# Patient Record
Sex: Female | Born: 1965 | Race: White | Hispanic: No | Marital: Single
Health system: Midwestern US, Academic
[De-identification: ages and names within clinical notes are randomized; demographics above are authoritative.]

---

## 2019-03-08 ENCOUNTER — Encounter: Admit: 2019-03-08 | Discharge: 2019-03-08

## 2019-03-08 NOTE — Progress Notes
..  Patient arrived to [facility] for COVID-19 testing 03/08/19 0947. Patient identity confirmed via photo I.D. Nasopharyngeal procedure explained to the patient.   Nasopharyngeal swab completed left  Patient education provided given and instructed patient self isolate until contacted w/ results and further instructions.   Swab collected by Rexanne Mano RN.    Date symptoms began/reason for testing: 7-7 cough, sore throat, diarrhea, muscle aches, chills, congestion, fatigue, HA, runny nose

## 2019-03-09 ENCOUNTER — Encounter: Admit: 2019-03-08 | Discharge: 2019-03-09

## 2019-03-09 DIAGNOSIS — Z1159 Encounter for screening for other viral diseases: Secondary | ICD-10-CM

## 2019-03-09 LAB — COVID-19 (SARS-COV-2) PCR

## 2019-03-09 NOTE — Progress Notes
Called patient and verified full name and date of birth. Informed patient of NEGATIVE COVID 19 testing. Patient was asked if they were still experiencing any symptoms and responded with headache, diarrhea.  Patient was advised to contact PCP, specialist, and/or ordering provider physician for ongoing symptoms. Patient had no further questions.    Patient advised to notify their direct supervisor of negative results to make plans to return to work. Advised that if they continue to have symptoms after their negative results, they should notify their direct supervisor that test was negative but they are continuing to have symptoms. Advised to remain off work while they are having symptoms, following the sick time policy.    Employee notified that if they have been away from work longer than three days, they should contact [employer] at [phone] to obtain a return-to-work clearance.     Edson Snowball, RN

## 2019-03-09 NOTE — Progress Notes
Pt called regarding COVID 19 test results.  No answer.  Voice message left with callback number.  Will try to call back later.    Airrion Otting C. Maigan Bittinger, RN

## 2021-08-30 IMAGING — CT BRAIN WO(Adult)
2 of 4 series · 13 of 47 positions shown, 16 images · non-contrast
Comparison: No relevant prior studies available.

DIAGNOSTIC STUDIES

EXAM:  CT HEAD WITHOUT INTRAVENOUS CONTRAST  ([id number])
INDICATION: confusion ARRYTHMIA, PALPITATIONS, "DOESN'T FEEL RIGHT" PER PATIENT. HTN WITH SCIATIC
PAIN. HEADACHE. PT COULDN'T REMOVE EAR PIERCINGS. AB CT/NM: 0/0
TECHNIQUE: Axial computed tomography images of the head/brain without intravenous contrast.
Sagittal and coronal reformatted images were created and reviewed.

[Series 4: brain cor 5.00 hr40 s3 · coronal · 0.28mm/px · 3 of 39 slices shown]
[im 13/39  brain]
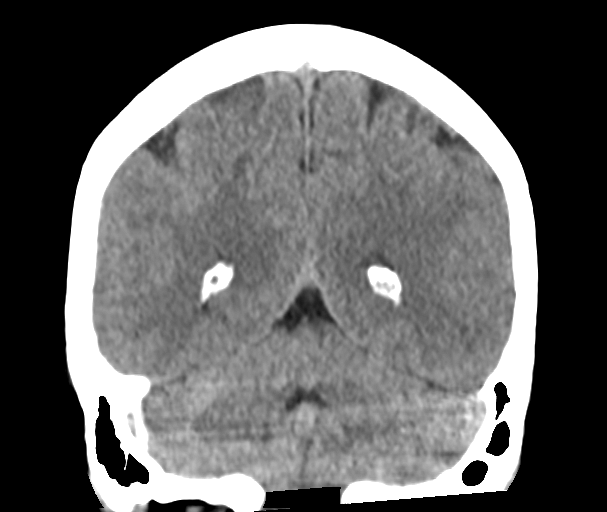
[im 17/39  brain]
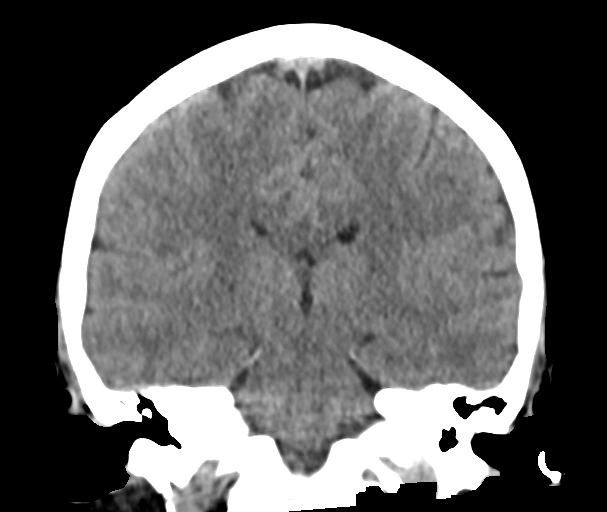
[im 22/39  brain]
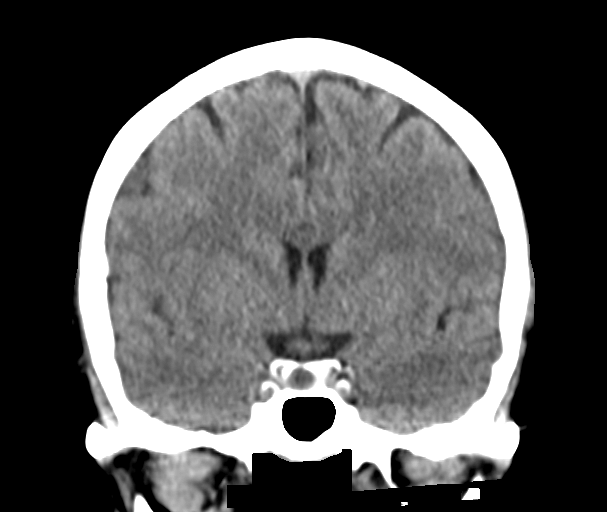

[Series 8: brain ax 2.00 hr60 s3 · axial · 0.34mm/px · z∈[-495,-373]mm · 10 of 71 slices shown, 13 images]
[im 7/71  brain]
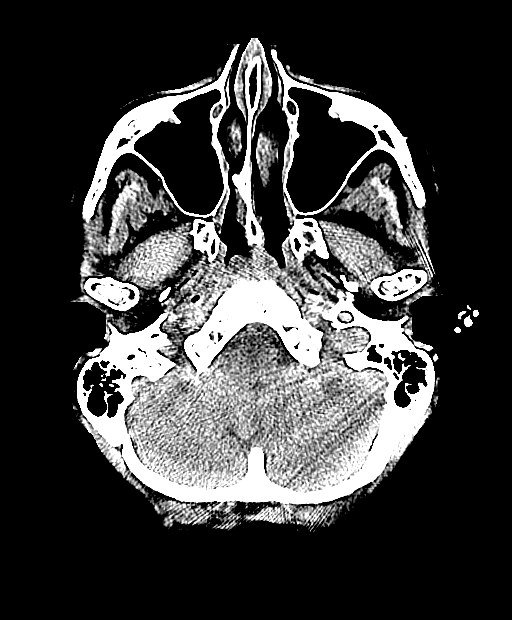
[im 7/71  bone]
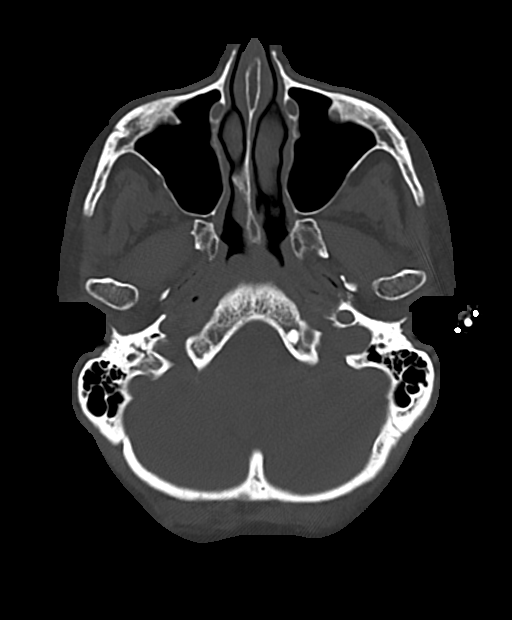
[im 14/71  brain]
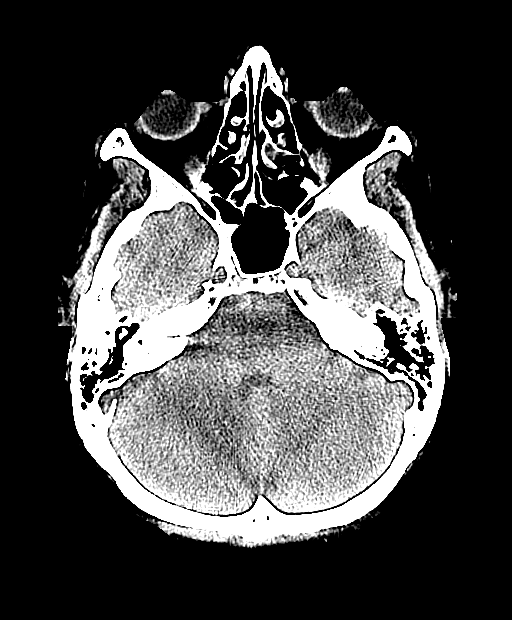
[im 21/71  brain]
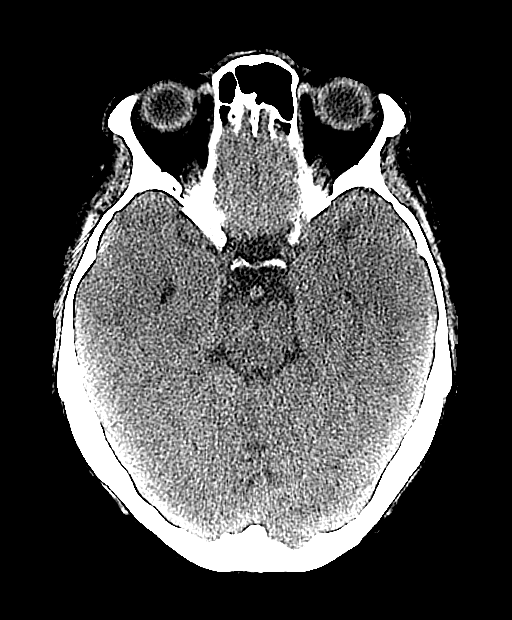
[im 27/71  brain]
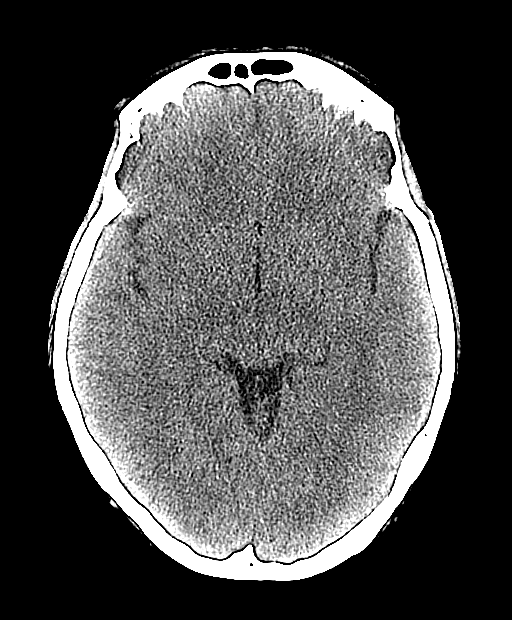
[im 34/71  brain]
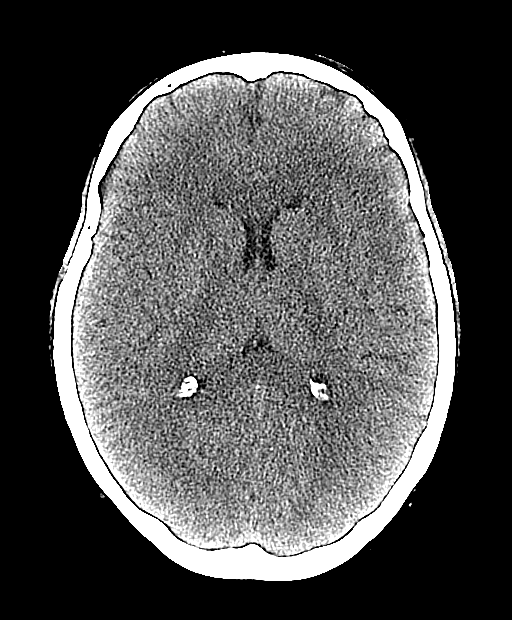
[im 34/71  bone]
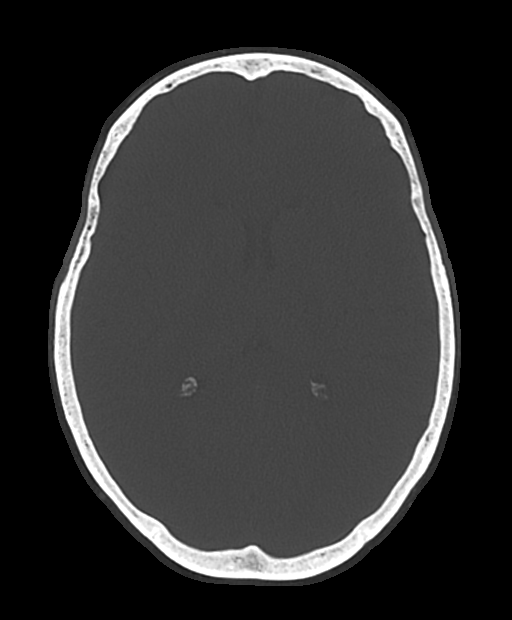
[im 41/71  brain]
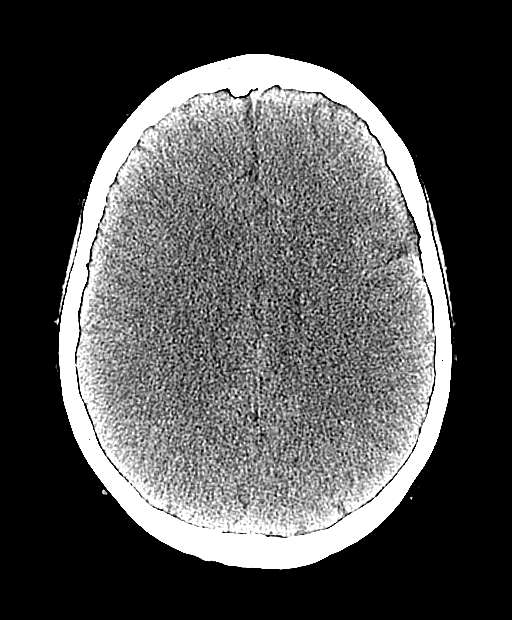
[im 47/71  brain]
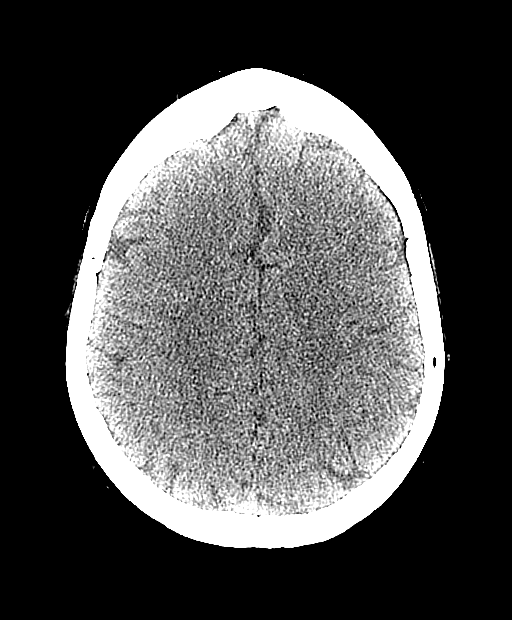
[im 54/71  brain]
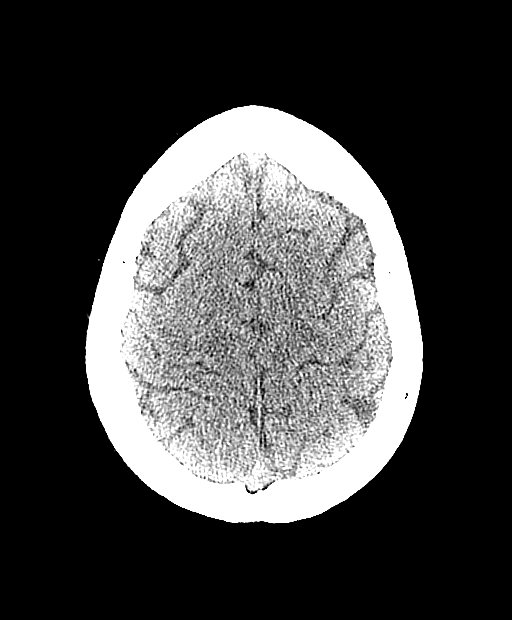
[im 61/71  brain]
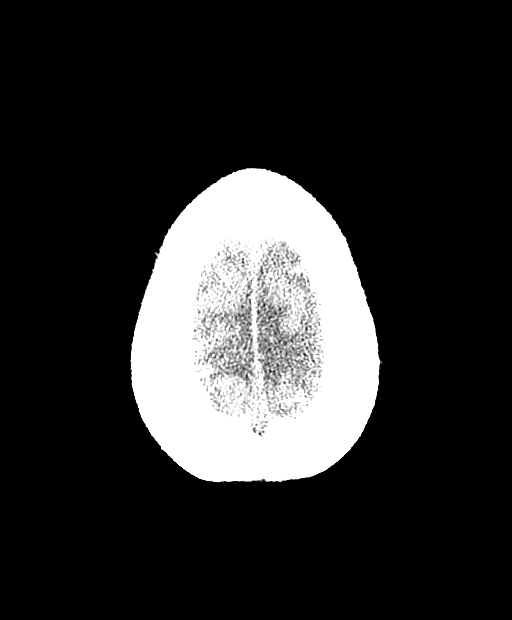
[im 61/71  bone]
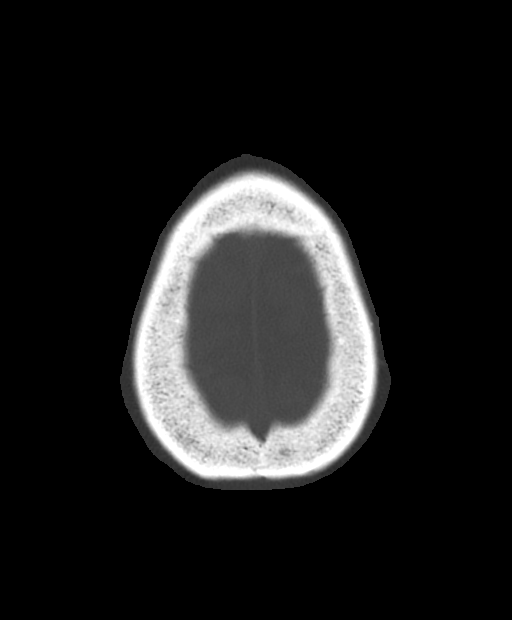
[im 67/71  brain]
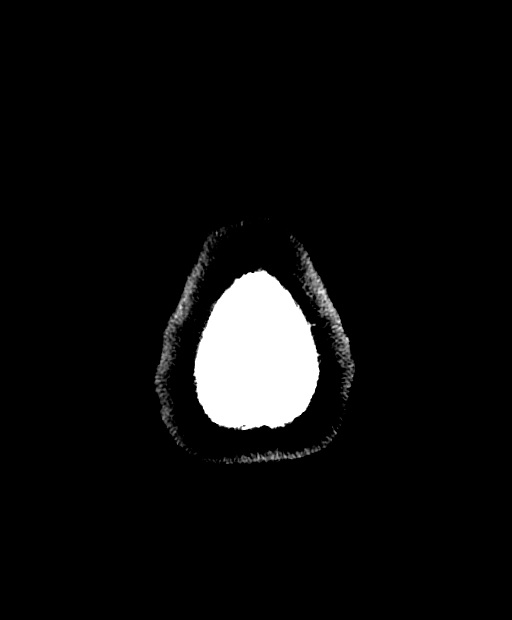

[13 of 47 positions shown; findings below may reference images not displayed]

All CT scans at this facility use dose modulation, interval reconstruction, and/or weight-based
dosing when appropriate to reduce radiation dose to as low as reasonably achievable.

Number of previous computed tomography exams in the last 12 months is 0. Number of previous nuclear
medicine myocardial perfusion studies in the last 12 months is 0.
FINDINGS: BRAIN:  NO evidence of posterior fossa/cerebellar hemorrhage or mass.

NO evidence of supratentorial hemorrhage or mass.

VENTRICLES:  The cerebral ventricles are normal in size.

BONES/JOINTS:  Calvarium and skull base demonstrate NO acute changes.

SOFT TISSUES:  Soft tissues demonstrate NO fluid collections or foreign body.

VASCULATURE:  Minimal arterial vascular calcification.

SINUSES:  The paranasal sinuses demonstrate NO significant opacifications or air-fluid levels.

MASTOID AIR CELLS:  NO opacifications or air-fluid levels.
IMPRESSION: - NO evidence of acute intracranial hemorrhage or mass.

Tech Notes:

ARRYTHMIA, PALPITATIONS, "DOESN'T FEEL RIGHT" PER PATIENT.  HTN WITH SCIATIC PAIN.  HEADACHE.  PT
COULDN'T REMOVE EAR PIERCINGS.  AB
CT/NM: 0/0

## 2021-08-30 IMAGING — CR [ID]
1 series · 1 of 1 positions shown · non-contrast
Comparison: No relevant prior studies available.

DIAGNOSTIC STUDIES

EXAM:  XR CHEST, 1 VIEW  ([id number])
INDICATION: hypertension ARRYTHMIA, PALPITATIONS, "DOESN'T FEEL RIGHT" PER PATIENT. HTN WITH
SCIATIC PAIN. HEADACHE. PT COULDN'T REMOVE EAR PIERCINGS. AB CT/NM: 0/0
TECHNIQUE: Single view of the chest.

[x chest ap]
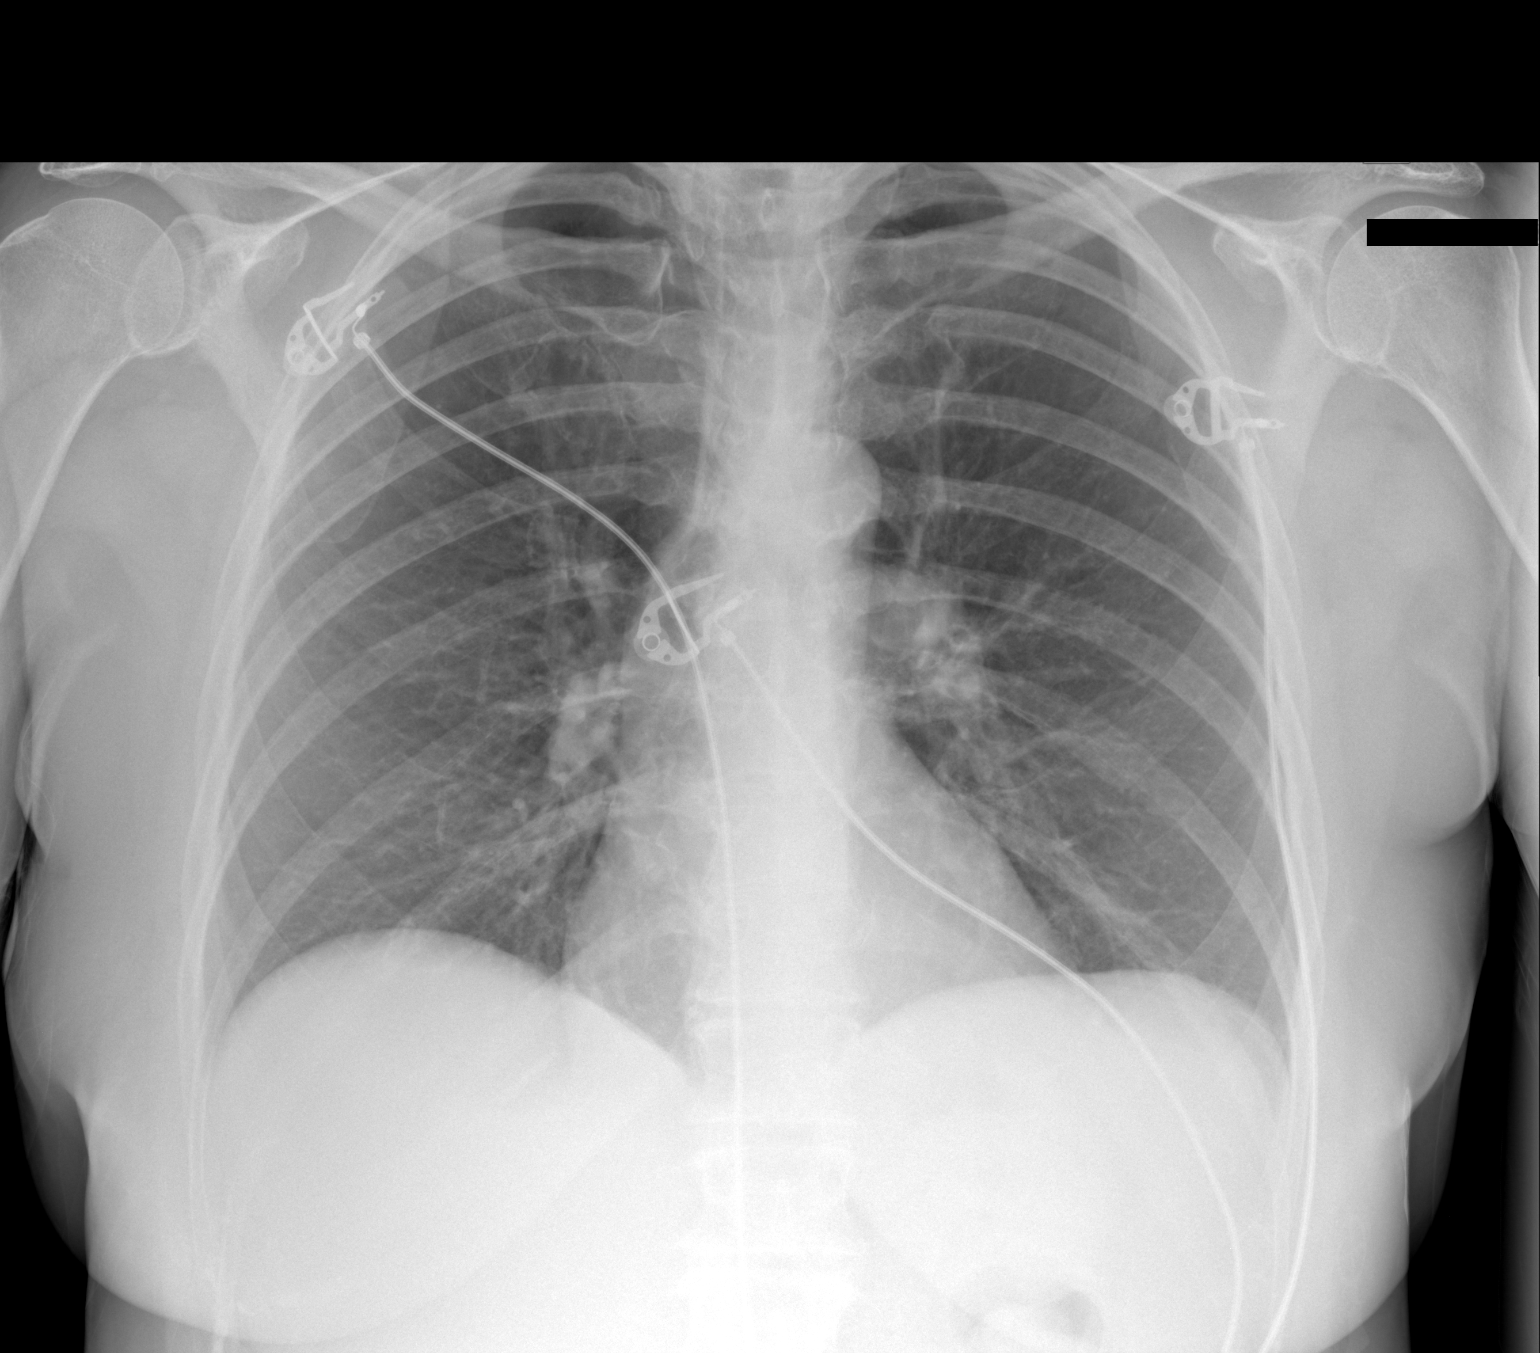

[1 of 1 positions shown; findings below may reference images not displayed]

FINDINGS: LUNGS:  The lungs demonstrate NO consolidations or significant effusions.

HEART:  The heart borders are mostly obscured, apex NOT visualized.

BONES/JOINTS:  No definite acute changes.

VASCULATURE:  Mild arterial vascular calcification.
IMPRESSION: - NO definite acute cardiopulmonary process.

Tech Notes:

ARRYTHMIA, PALPITATIONS, "DOESN'T FEEL RIGHT" PER PATIENT.  HTN WITH SCIATIC PAIN.  HEADACHE.  PT
COULDN'T REMOVE EAR PIERCINGS.  AB
CT/NM: 0/0

## 2021-08-31 IMAGING — MR Head^Brain
1 series · 48 of 48 positions shown · non-contrast
Comparison: none

[Series 4: cow · axial · 0.8mm · 0.56mm/px · z∈[-67,+4]mm · 48 of 87 slices shown]
[im 1/87]
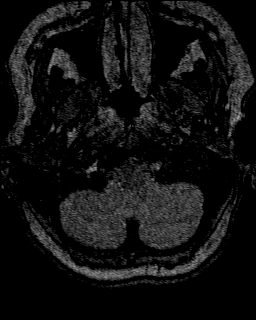
[im 2/87]
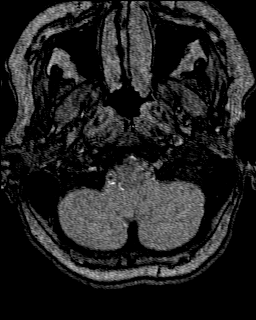
[im 4/87]
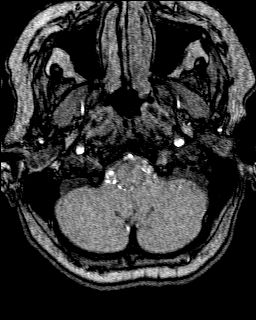
[im 6/87]
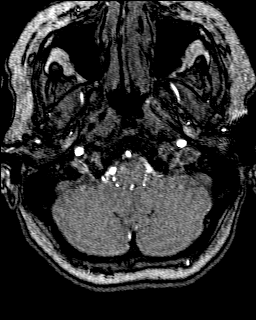
[im 8/87]
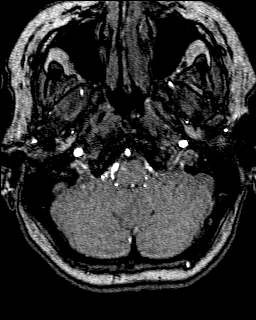
[im 10/87]
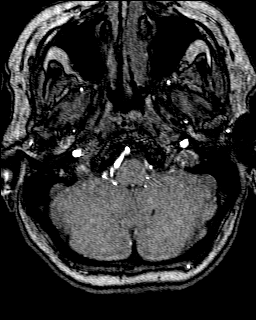
[im 11/87]
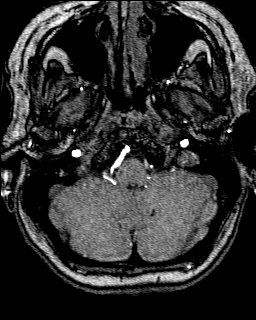
[im 13/87]
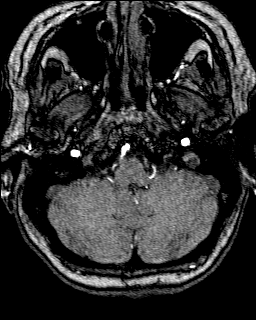
[im 15/87]
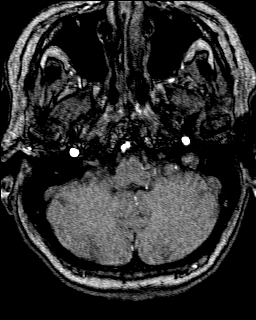
[im 17/87]
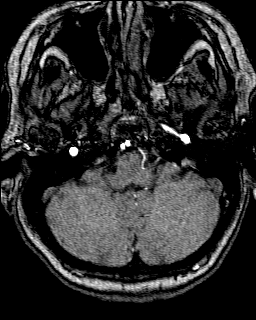
[im 19/87]
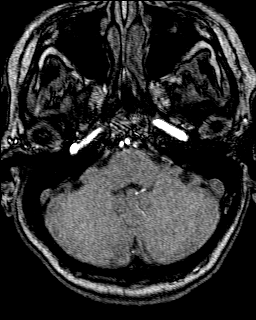
[im 21/87]
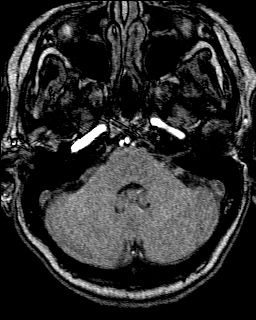
[im 22/87]
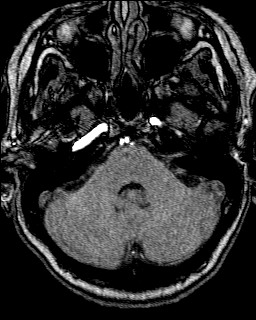
[im 24/87]
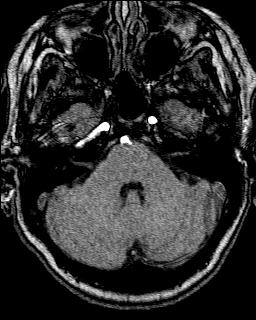
[im 26/87]
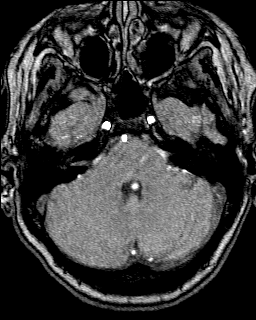
[im 28/87]
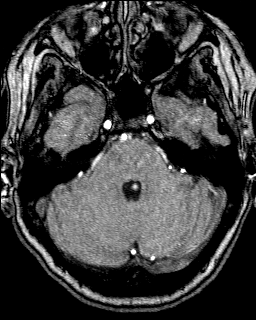
[im 30/87]
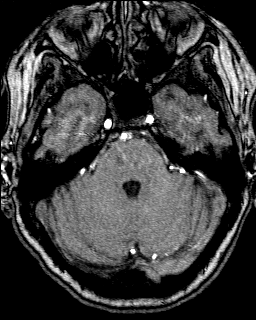
[im 32/87]
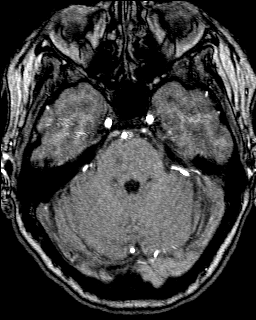
[im 33/87]
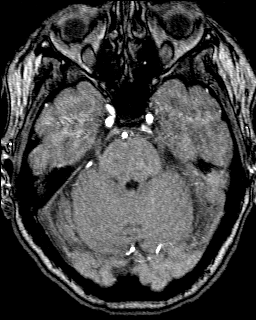
[im 35/87]
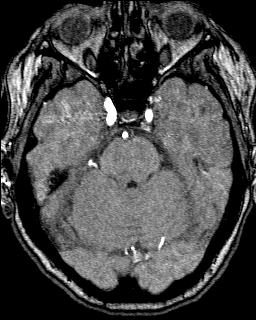
[im 37/87]
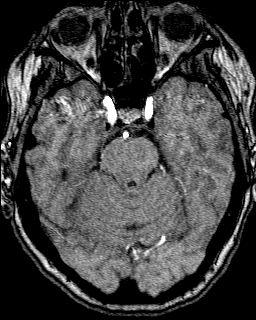
[im 39/87]
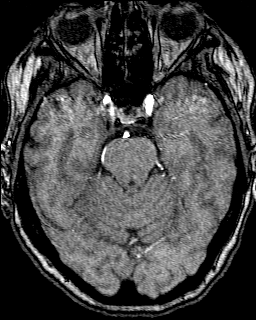
[im 41/87]
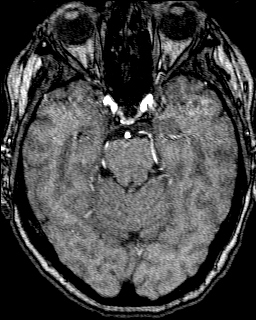
[im 43/87]
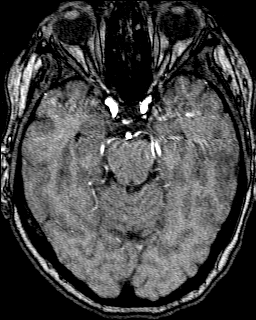
[im 44/87]
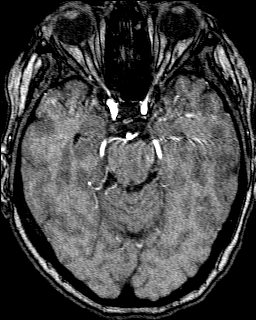
[im 46/87]
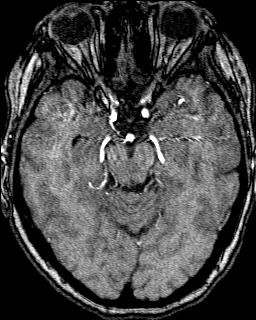
[im 48/87]
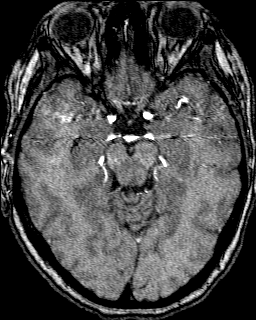
[im 50/87]
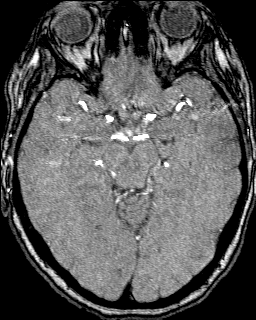
[im 52/87]
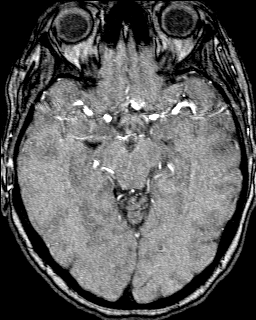
[im 54/87]
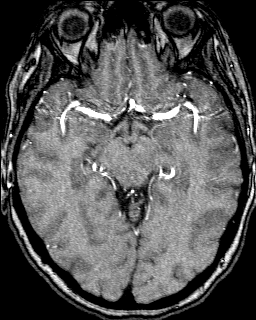
[im 55/87]
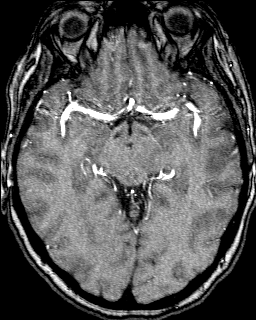
[im 57/87]
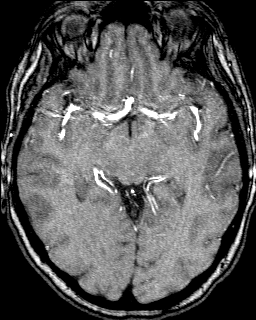
[im 59/87]
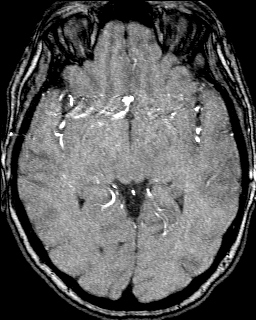
[im 61/87]
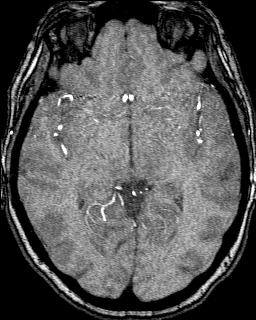
[im 63/87]
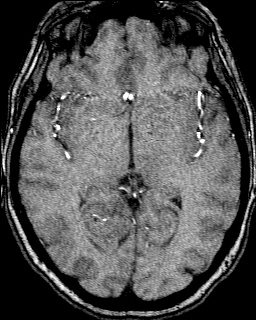
[im 65/87]
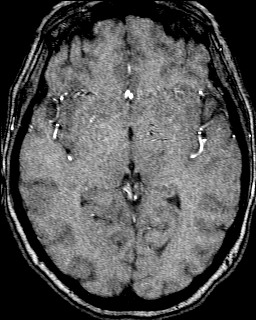
[im 66/87]
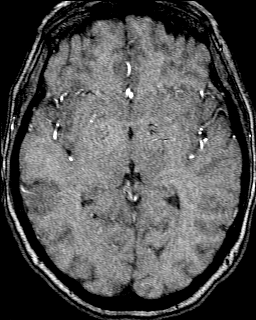
[im 68/87]
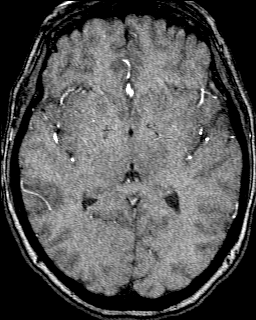
[im 70/87]
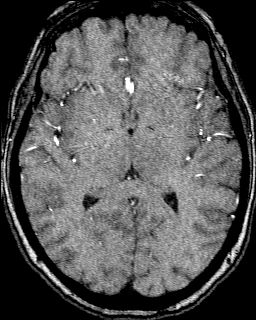
[im 72/87]
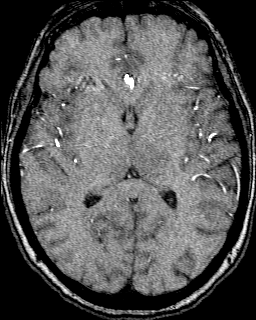
[im 74/87]
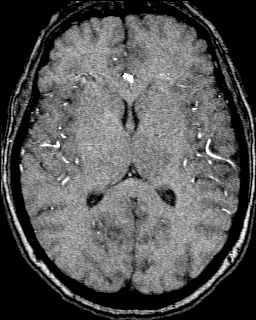
[im 76/87]
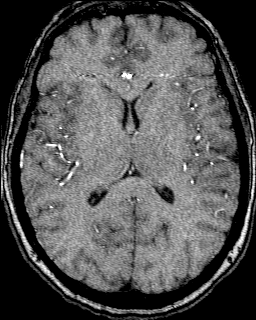
[im 77/87]
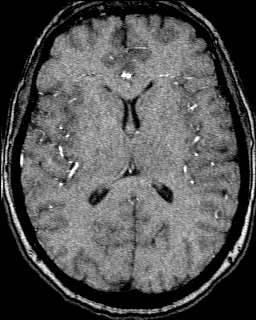
[im 79/87]
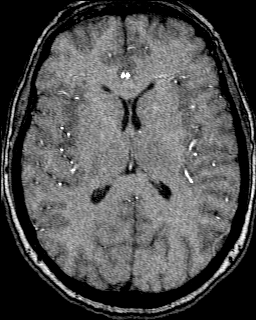
[im 81/87]
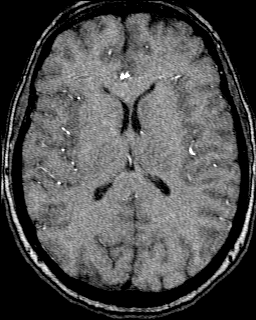
[im 83/87]
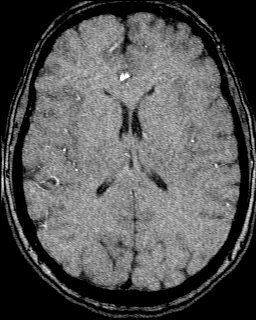
[im 85/87]
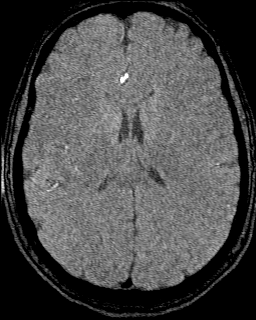
[im 87/87]
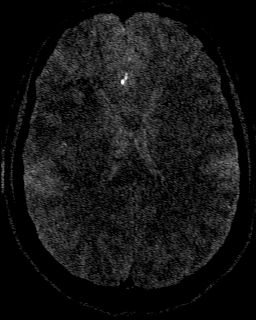

[48 of 48 positions shown; findings below may reference images not displayed]

08/31/21

EXAM

MR angiogram head

INDICATION

possible TIA
PT PRESENTED TO ER LAST NIGHT BY EMS FOR HIGH BP, NAUSEA, DIZZINESS, ACUTE ONSET HEADACHE AND "JUST
NOT FEELING RIGHT", BLURRY VISION. ALL SYMPTOMS IMPROVED EXCEPT HEADACHE.  R/O TIA. RG

FINDINGS

MR angiography of the head was performed without contrast.

There is normal signal intensity within the anterior, middle and posterior cerebral arteries. The
distal portions of the internal carotid arteries appear normal. The basilar artery appears normal.

No large vessel occlusion or aneurysm is identified.

IMPRESSION

No significant MR angiographic abnormality of the circle Willis is identified.

Tech Notes:

PT PRESENTED TO ER LAST NIGHT BY EMS FOR HIGH BP, NAUSEA, DIZZINESS, ACUTE ONSET HEADACHE AND "JUST
NOT FEELING RIGHT", BLURRY VISION. ALL SYMPTOMS IMPROVED EXCEPT HEADACHE.  R/O TIA. RG

## 2021-08-31 IMAGING — MR L-spine^Routine
5 series · 34 of 48 positions shown · non-contrast
Comparison: none

[Series 2: T2 · sagittal · 4.0mm · 0.62mm/px · 7 of 13 slices shown (1 of 2)]
[im 1/13]
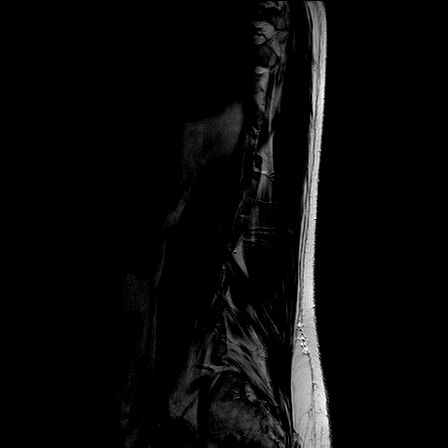
[im 3/13]
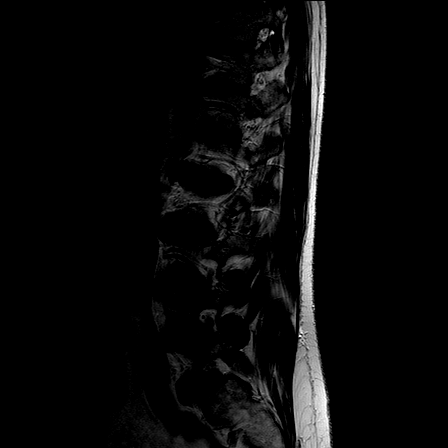
[im 5/13]
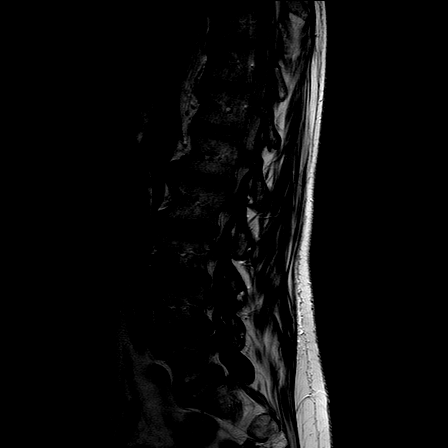
[im 7/13]
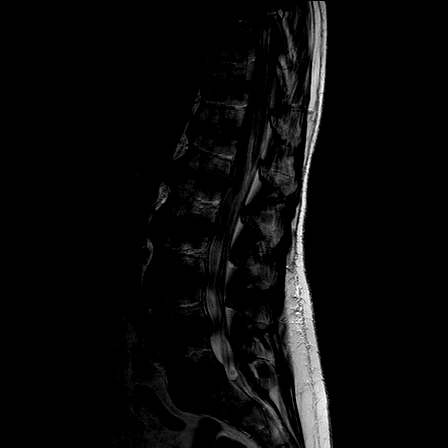
[im 9/13]
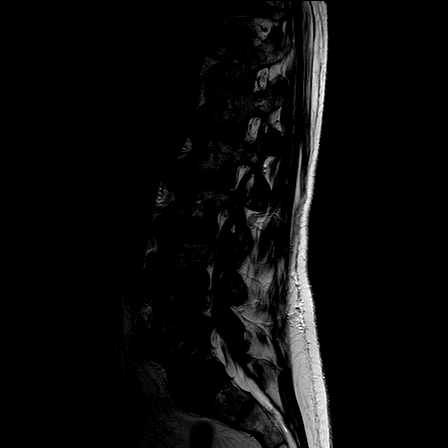
[im 11/13]
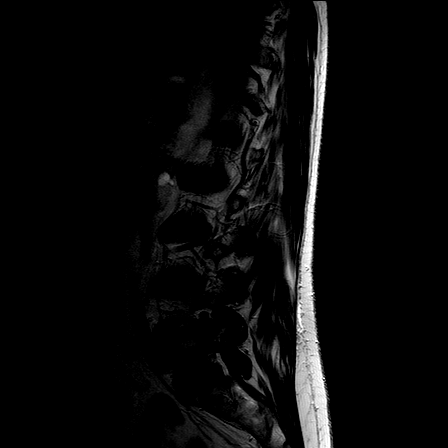
[im 13/13]
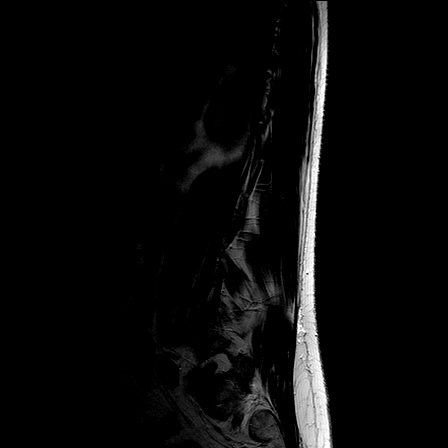

[Series 3: T1 · sagittal · 4.0mm · 0.73mm/px · 7 of 13 slices shown (1 of 2)]
[im 1/13]
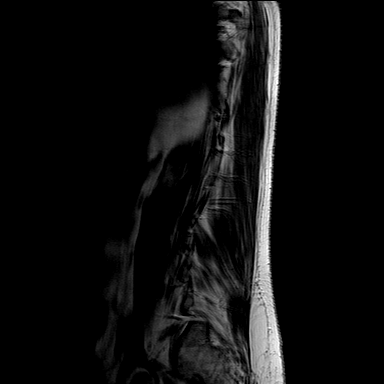
[im 3/13]
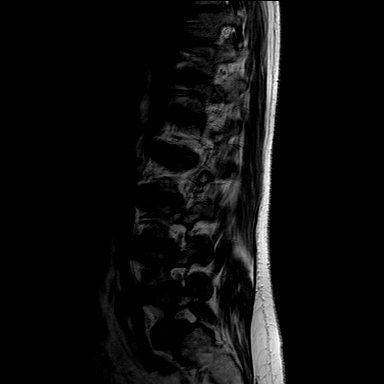
[im 5/13]
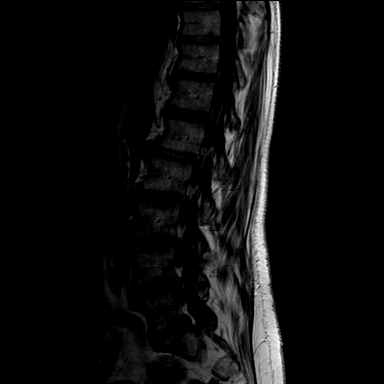
[im 7/13]
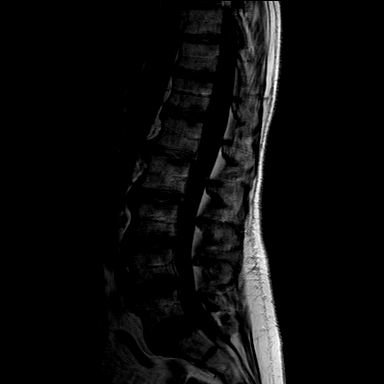
[im 9/13]
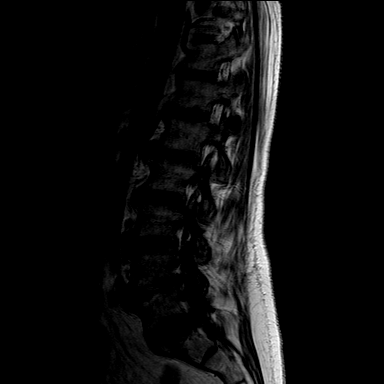
[im 11/13]
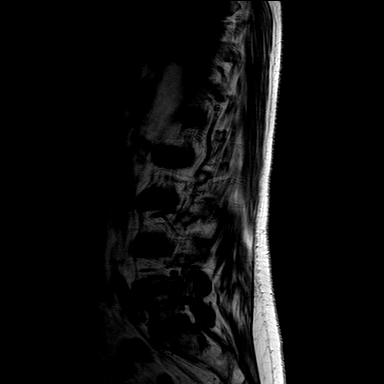
[im 13/13]
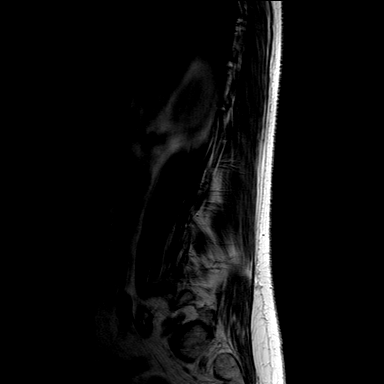

[Series 4: STIR · sagittal · 4.0mm · 0.55mm/px · 4 of 13 slices shown]
[im 1/13]
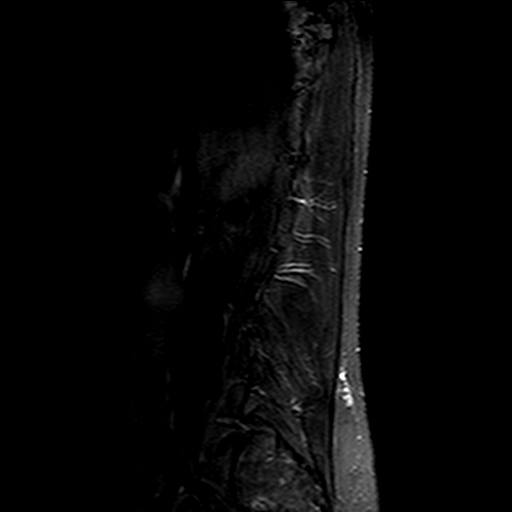
[im 3/13]
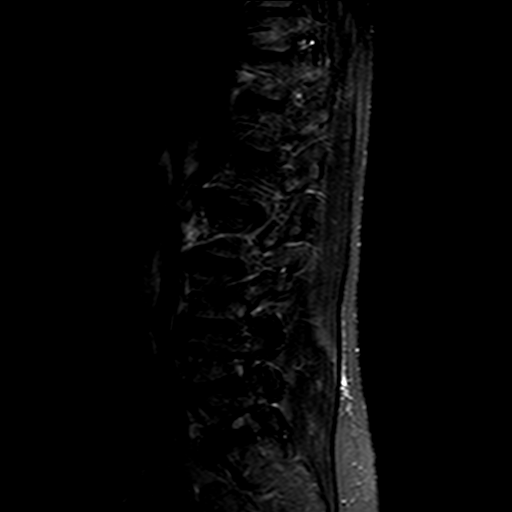
[im 5/13]
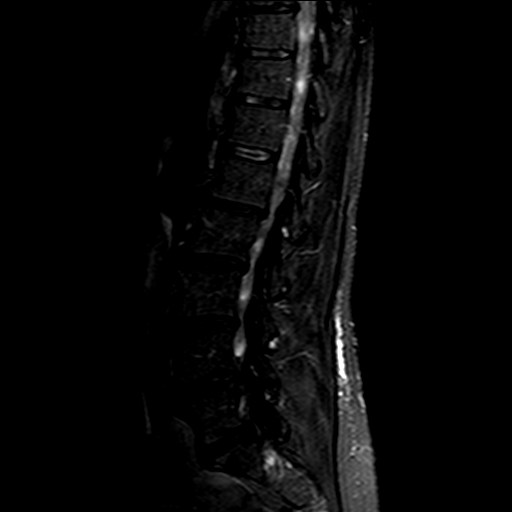
[im 8/13]
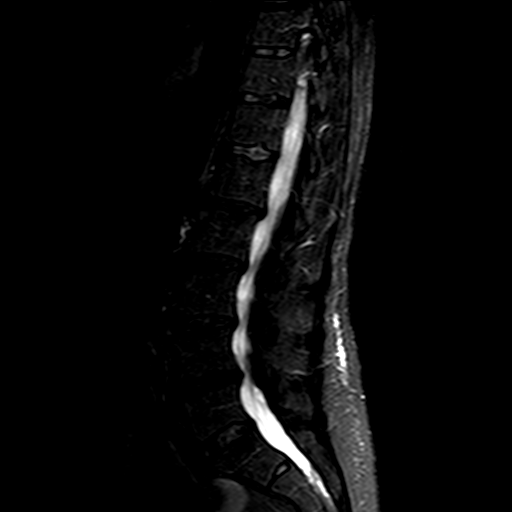

[Series 5: T2 · axial · 4.5mm · 0.49mm/px · z∈[-96,+88]mm · 8 of 29 slices shown (2 of 2)]
[im 1/29]
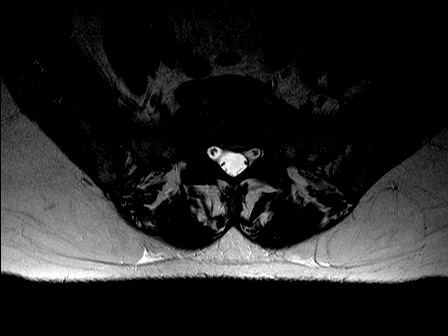
[im 5/29]
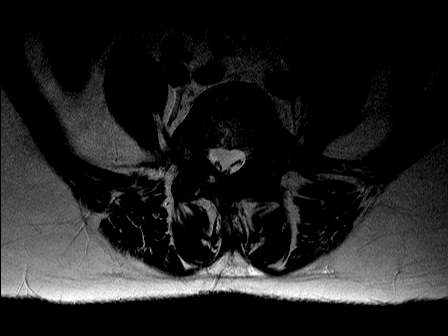
[im 9/29]
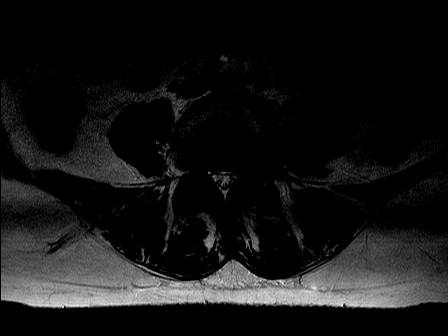
[im 13/29]
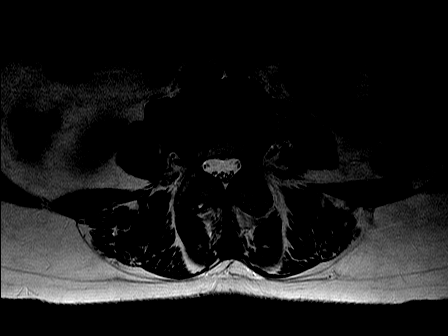
[im 16/29]
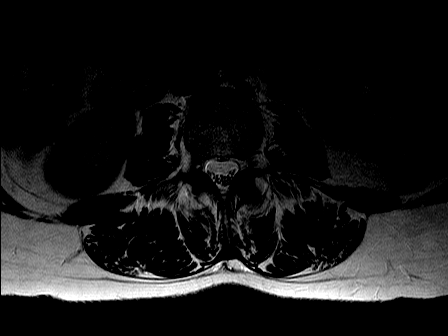
[im 20/29]
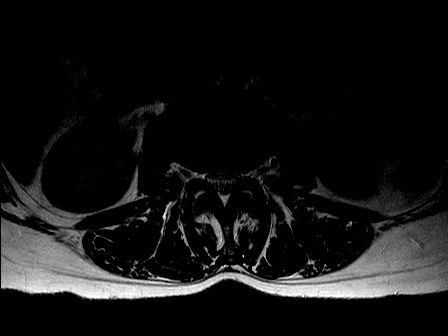
[im 24/29]
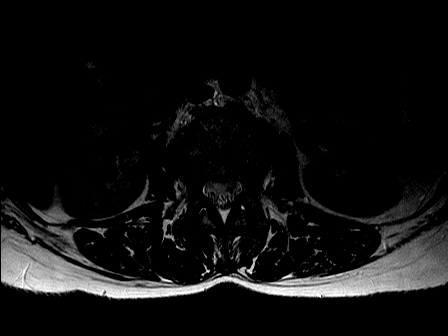
[im 29/29]
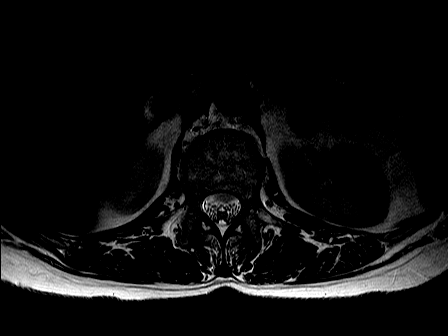

[Series 6: T1 · axial · 4.5mm · 0.86mm/px · z∈[-96,+88]mm · 8 of 29 slices shown (2 of 2)]
[im 1/29]
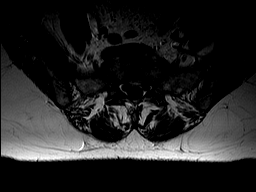
[im 5/29]
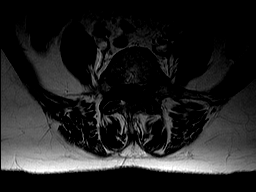
[im 9/29]
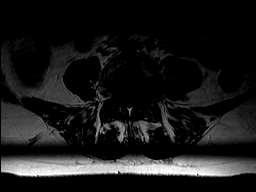
[im 13/29]
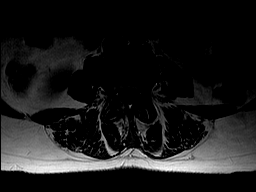
[im 16/29]
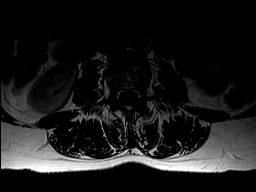
[im 20/29]
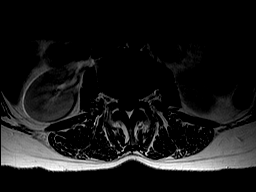
[im 24/29]
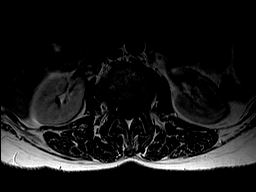
[im 29/29]
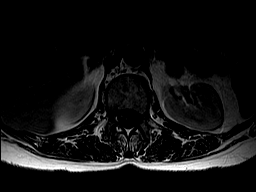

[34 of 48 positions shown; findings below may reference images not displayed]

08/31/21

Lienad, Jullon

EXAM

MR lumbar spine wo con

INDICATION

Lumbar radiculopathy
LOW BACK PAIN X 1 MONTH WITH NUMBNESS TO  BILATERAL FEET WITH "PINS AND NEEDLES" FEELING
BILATERALLY.  RG

TECHNIQUE

MR lumbar spine wo con

COMPARISONS

None available

FINDINGS

Normal cord and marrow signal. Conus medullaris tip terminates at L1-L2.

L1-L2: Mild disc bulge. Mild central canal narrowing. Neural foramina are patent.

L2-L3: Minimal disc bulge. Mild central canal narrowing with ligamentum flavum hypertrophy. Patent
neural foramina.

L3-L4: Minimal disc bulge and mild ligamentum flavum hypertrophy with mild central canal narrowing.
Patent neural foramina.

L4-L5: Small disc bulge and ligamentum flavum hypertrophy with mild-to-moderate central canal
narrowing. Mild bilateral neural foraminal narrowing.

L5-S1: Patent central canal and neural foramina.

There are no acute findings in the extra vertebral soft tissues.

IMPRESSION

No acute findings. Degenerative changes as above.

Tech Notes:

LOW BACK PAIN X 1 MONTH WITH NUMBNESS TO  BILATERAL FEET WITH "PINS AND NEEDLES" FEELING
BILATERALLY.  RG

## 2021-08-31 IMAGING — US CARDUPBI
1 series · 13 of 16 positions shown · non-contrast
Comparison: none

[Series 1: us carotid duplex bi · 13 of 65 slices shown]
[im 1/65]
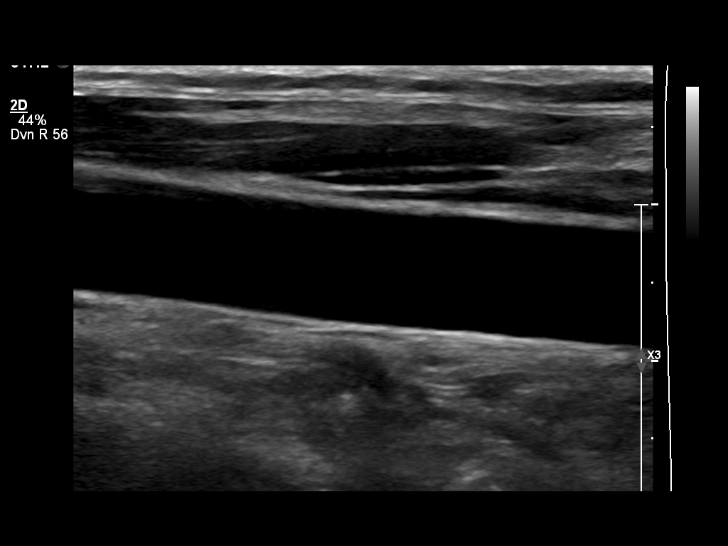
[im 5/65]
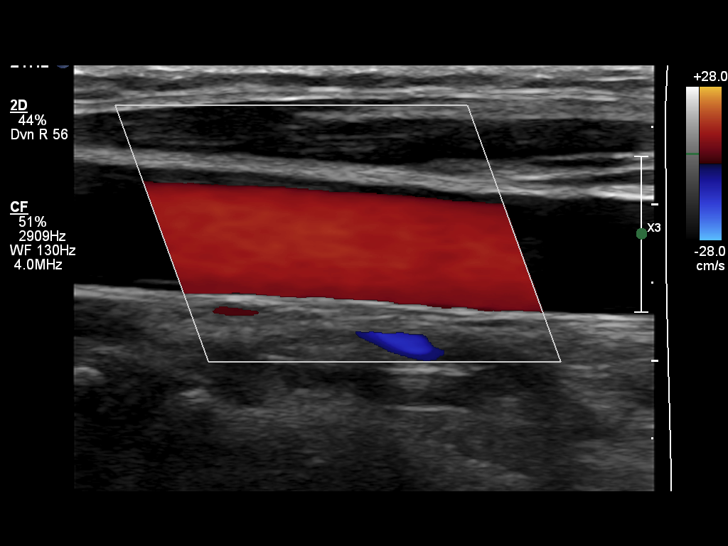
[im 13/65]
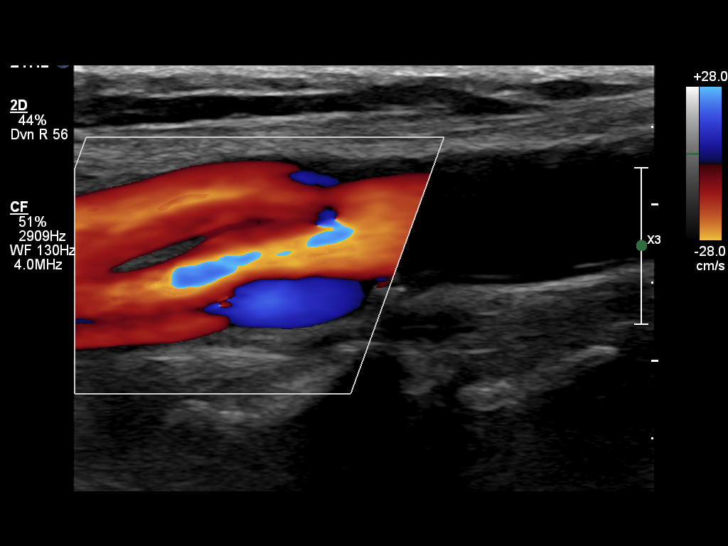
[im 18/65]
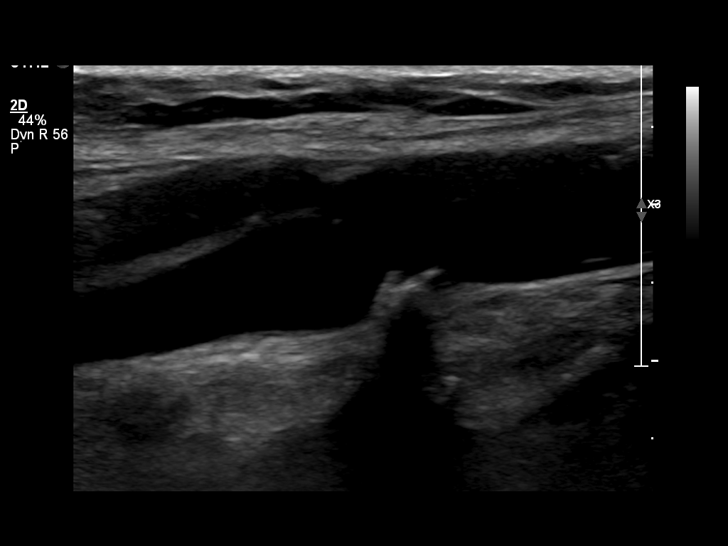
[im 22/65]
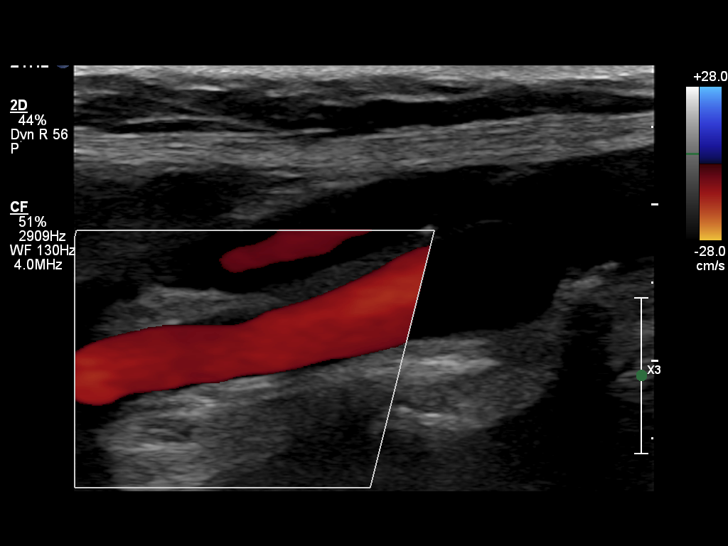
[im 26/65]
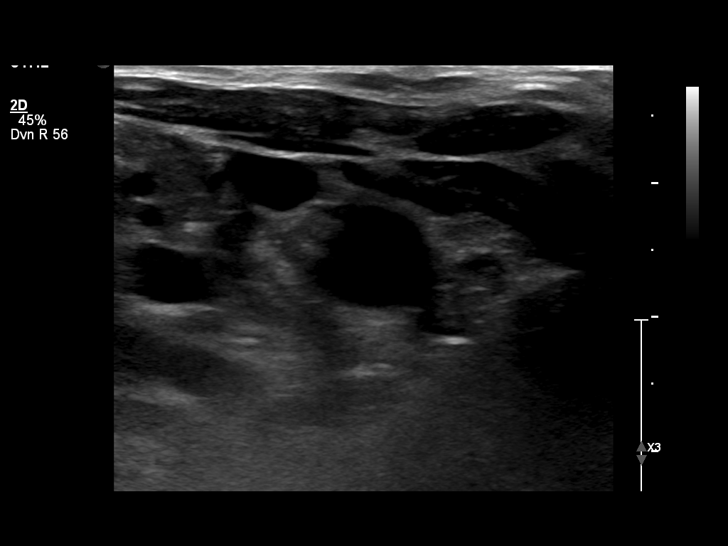
[im 35/65]
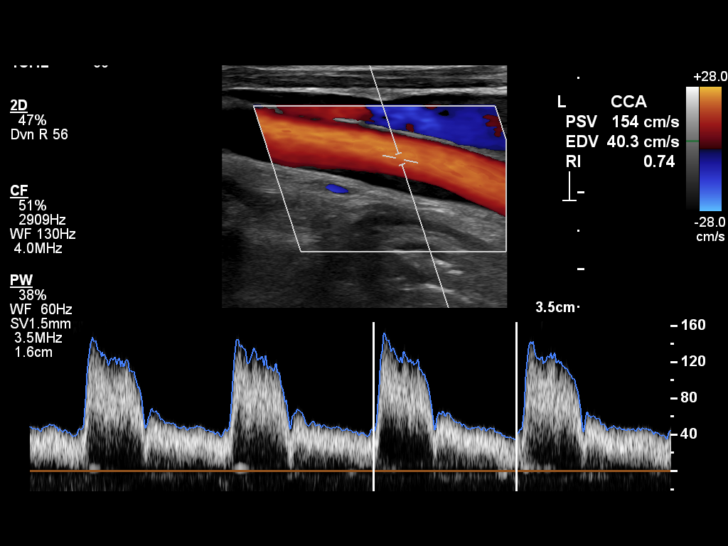
[im 39/65]
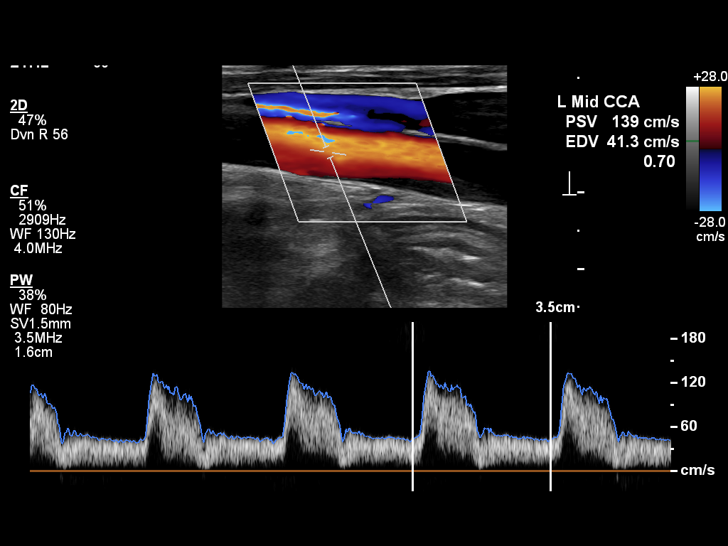
[im 43/65]
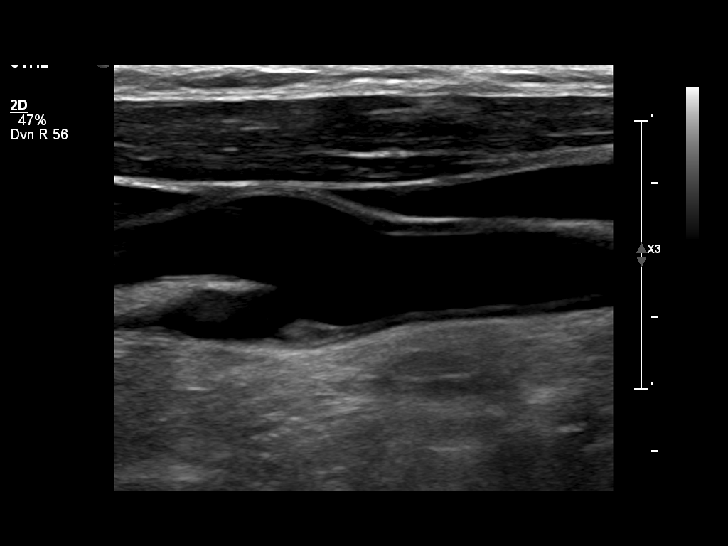
[im 47/65]
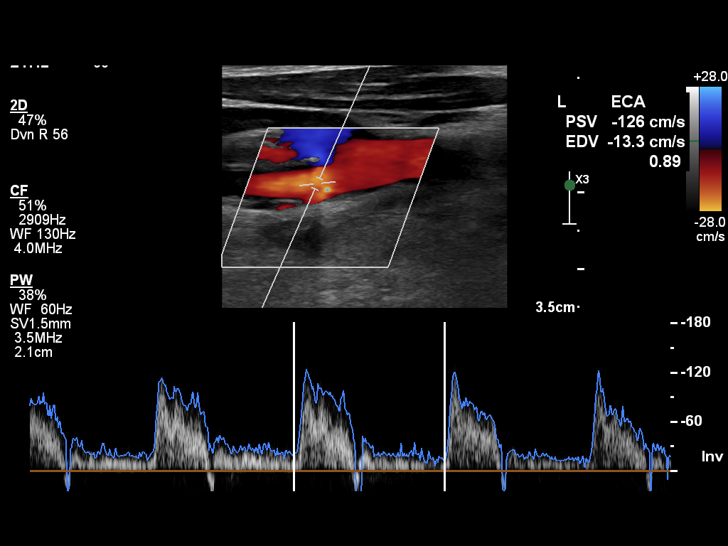
[im 52/65]
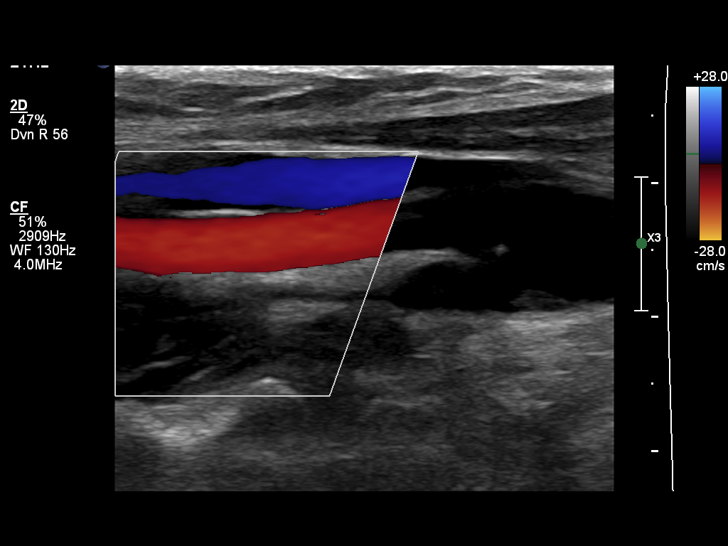
[im 60/65]
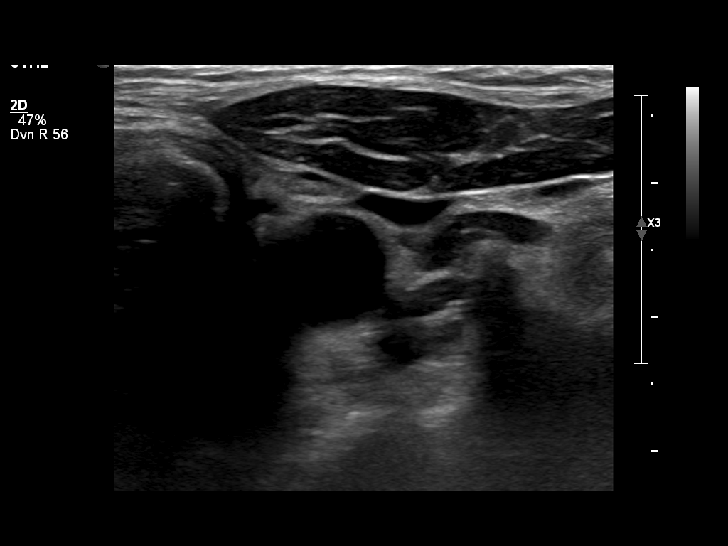
[im 65/65]
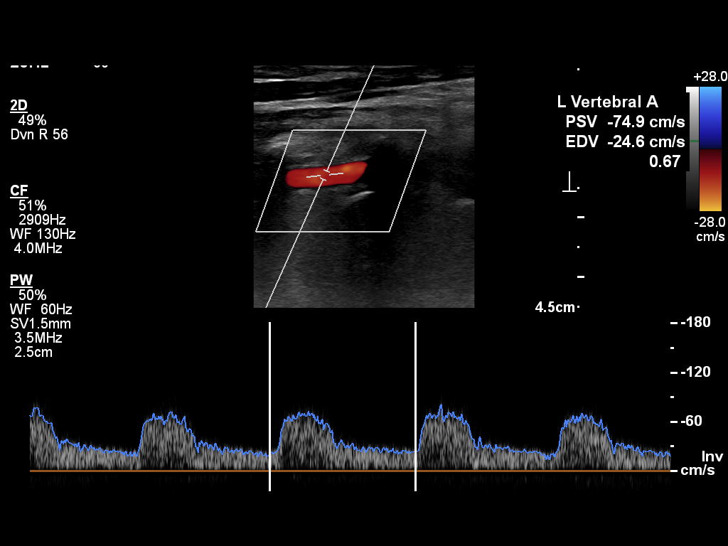

[13 of 16 positions shown; findings below may reference images not displayed]

08/31/21

Carper, Jaswant

EXAM

Carotid duplex ultrasound

INDICATION

possible TIA
poss tia; hx of htn and smoking; pt states she quit smoking 9 months ago

FINDINGS

Carotid duplex evaluation was performed bilaterally with 2D   color flow and Doppler waveform
analysis.

On the right there is partially calcified plaque in the distal common and proximal internal and
external carotid arteries. The peak systolic velocity in right ICA is 132 cm/second. The end-
diastolic velocity is 33 cm/second. The right ICA to common carotid artery ratio is 1.4. There is
forward flow in the right vertebral artery.

On the left, there is noncalcified plaque in the distal common proximal internal and external
carotid arteries. The peak systolic velocity in the left ICA is 129 cm/second. The end-diastolic
velocity in the left ICA is 46 cm/second. The left ICA to common carotid artery ratio is 1.0. There
is forward flow in the left vertebral artery.

IMPRESSION

There is plaque formation in the distal common and proximal internal carotid arteries bilaterally.
Velocity criteria, stenosis within the lower portion in the 50-69 percent range is identified
bilaterally.

Correlation was made with NASCET criteria.

Tech Notes:

poss tia; hx of htn and smoking; pt states she quit smoking 9 months ago

## 2021-08-31 IMAGING — MR Head^Brain
10 series · 46 of 48 positions shown · IV contrast (with contrast)
Comparison: none

[Series 2: T1 · sagittal · 5.0mm · 0.45mm/px · 3 of 20 slices shown (1 of 2)]
[im 1/20]
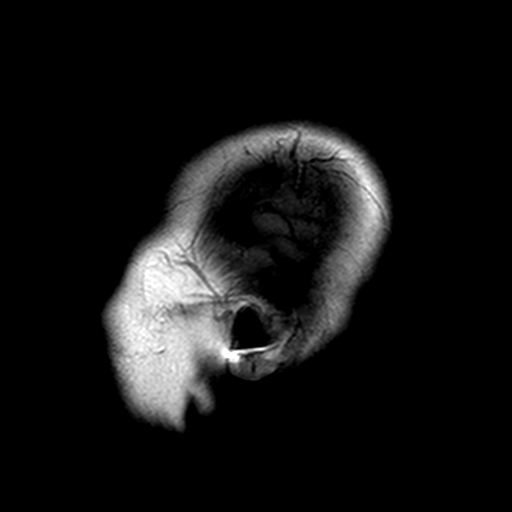
[im 10/20]
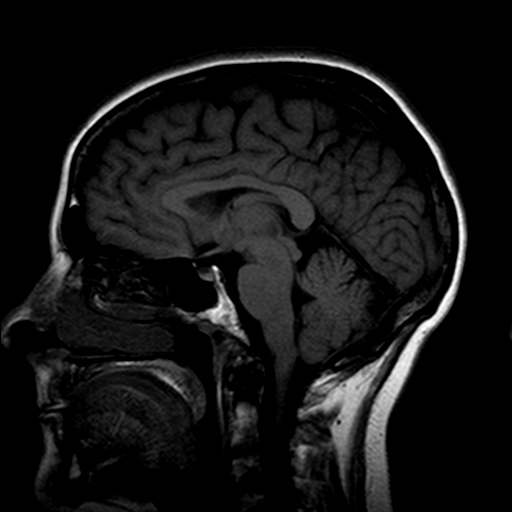
[im 20/20]
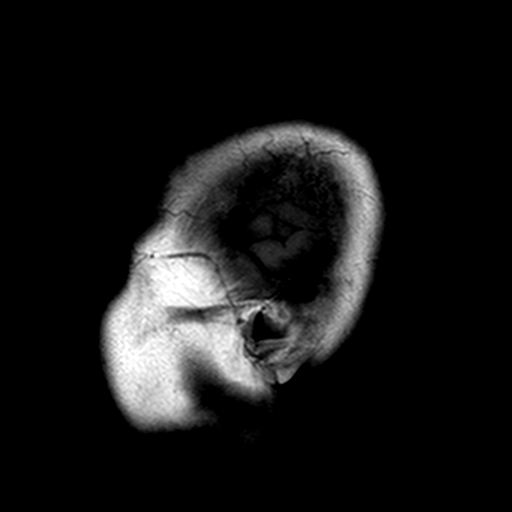

[Series 5: DWI · axial · 5.0mm · 1.80mm/px · z∈[-76,+54]mm · 11 of 63 slices shown (1 of 2)]
[im 1/63]
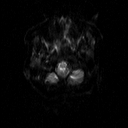
[im 7/63]
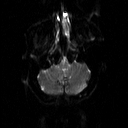
[im 13/63]
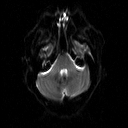
[im 19/63]
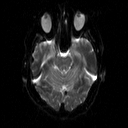
[im 25/63]
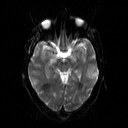
[im 32/63]
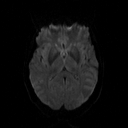
[im 38/63]
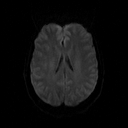
[im 44/63]
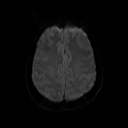
[im 50/63]
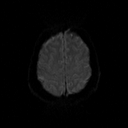
[im 56/63]
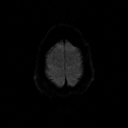
[im 63/63]
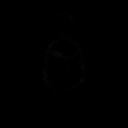

[Series 6: DWI · axial · 5.0mm · 1.80mm/px · z∈[-76,+54]mm · 4 of 21 slices shown (2 of 2)]
[im 1/21]
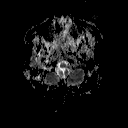
[im 7/21]
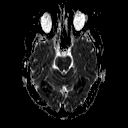
[im 14/21]
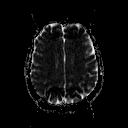
[im 21/21]
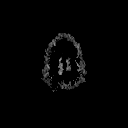

[Series 10: FLAIR · axial · 5.0mm · 0.45mm/px · z∈[-86,+64]mm · 4 of 24 slices shown]
[im 1/24]
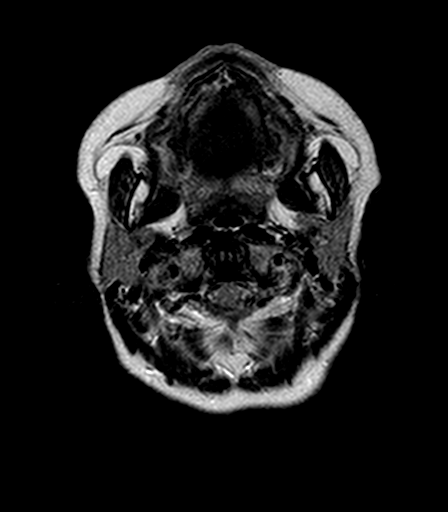
[im 8/24]
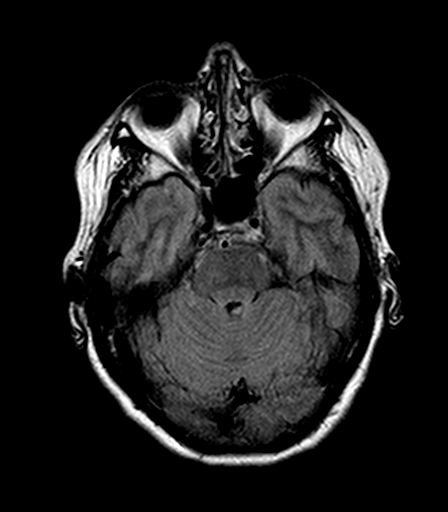
[im 16/24]
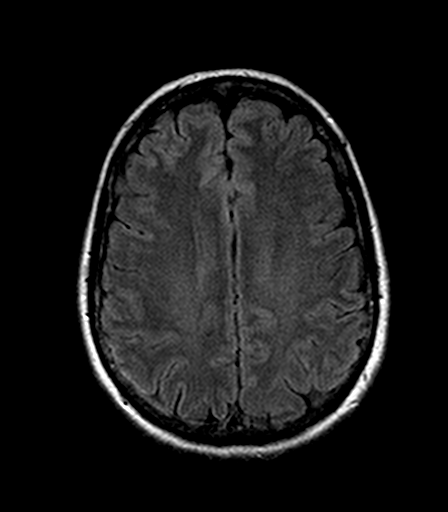
[im 24/24]
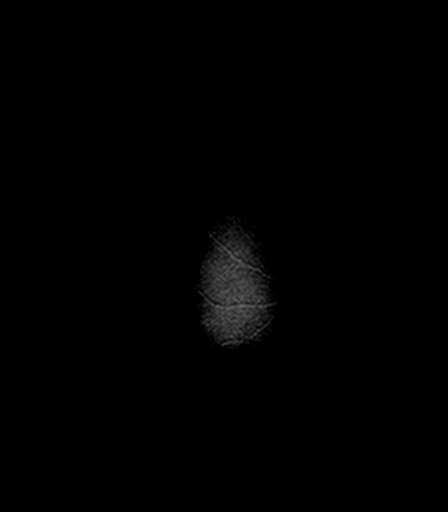

[Series 12: T2 · axial · 5.0mm · 0.72mm/px · z∈[-86,+64]mm · 4 of 24 slices shown (1 of 2)]
[im 1/24]
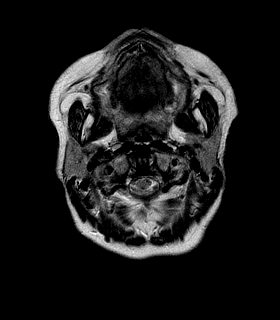
[im 8/24]
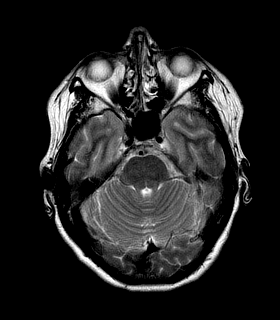
[im 16/24]
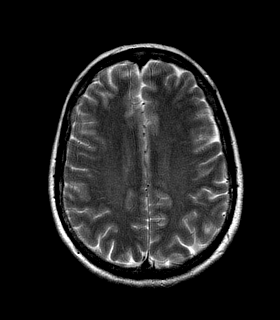
[im 24/24]
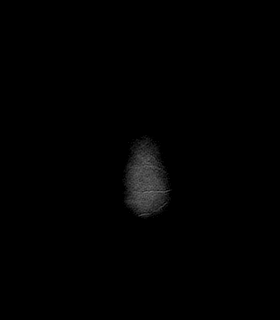

[Series 13: T1 · axial · 5.0mm · 0.45mm/px · z∈[-86,+64]mm · 4 of 24 slices shown (2 of 2)]
[im 1/24]
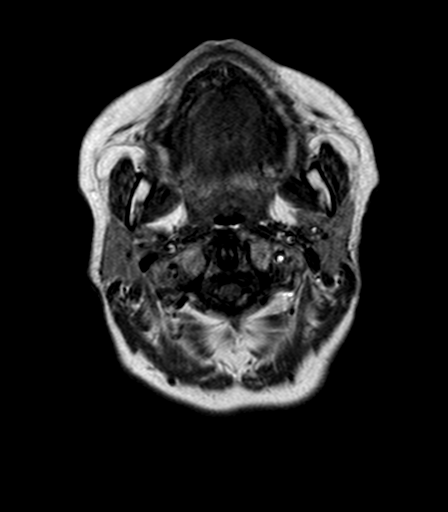
[im 8/24]
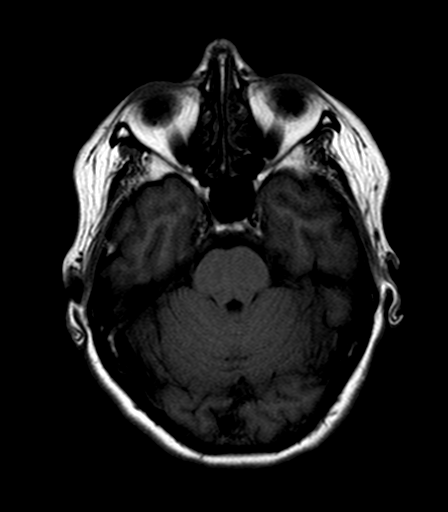
[im 16/24]
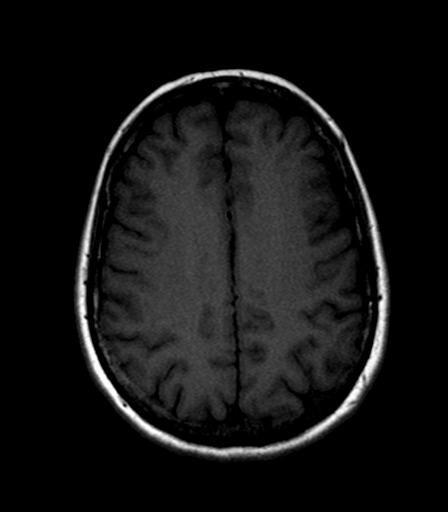
[im 24/24]
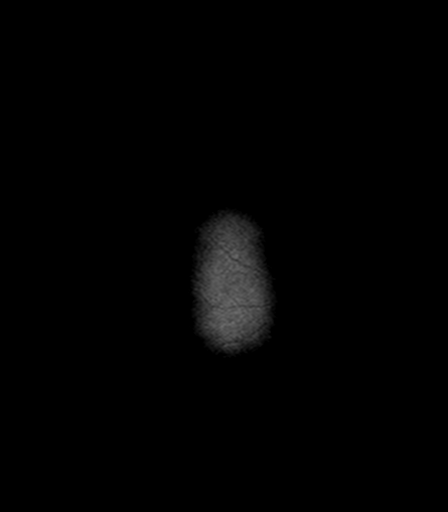

[Series 14: axial blood · axial · 5.0mm · 0.45mm/px · z∈[-85,-40]mm · 2 of 24 slices shown]
[im 1/24]
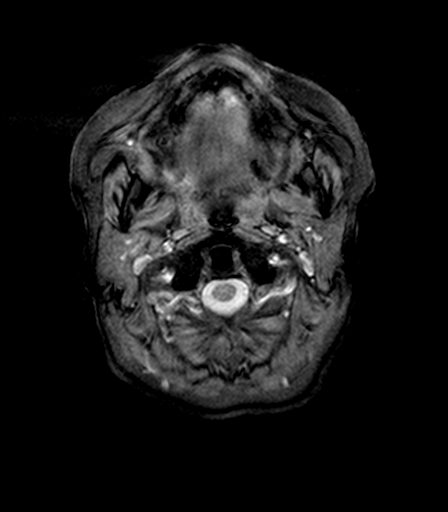
[im 8/24]
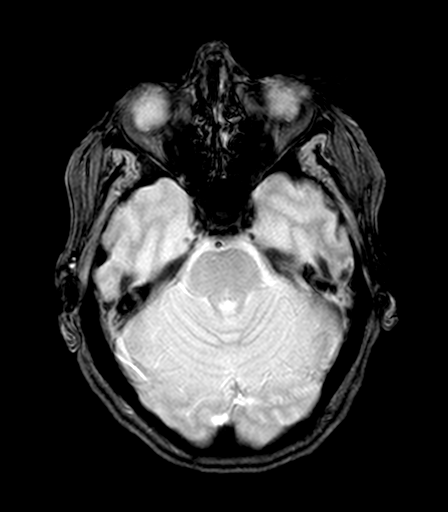

[Series 15: T2 · coronal · 5.0mm · 0.69mm/px · 5 of 26 slices shown (2 of 2)]
[im 1/26]
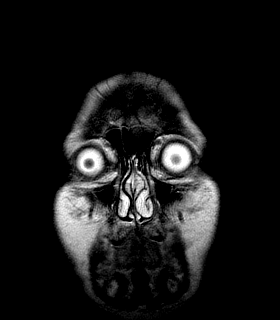
[im 7/26]
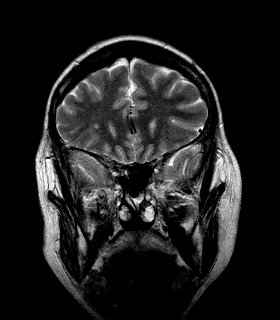
[im 13/26]
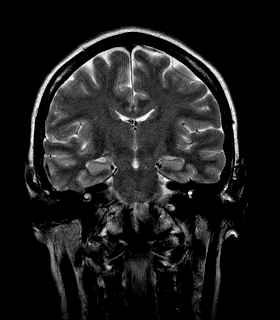
[im 19/26]
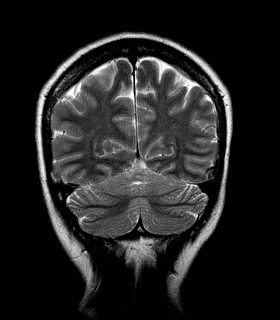
[im 26/26]
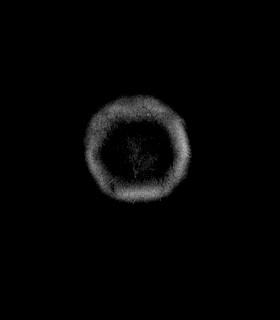

[Series 17: T1 fat-sat post-contrast · axial · 5.0mm · 0.90mm/px · z∈[-79,+70]mm · 4 of 24 slices shown (1 of 2)]
[im 1/24]
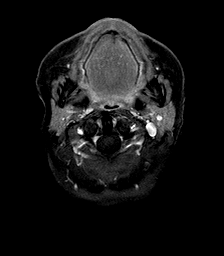
[im 8/24]
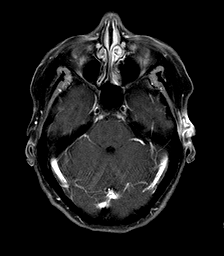
[im 16/24]
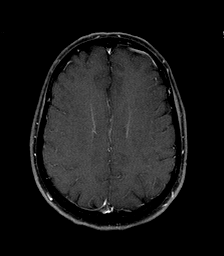
[im 24/24]
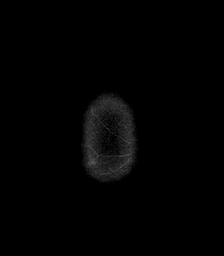

[Series 20: T1 fat-sat post-contrast · coronal · 5.0mm · 0.90mm/px · 5 of 26 slices shown (2 of 2)]
[im 1/26]
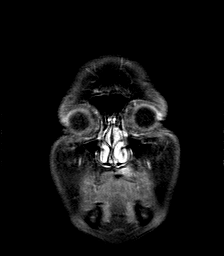
[im 7/26]
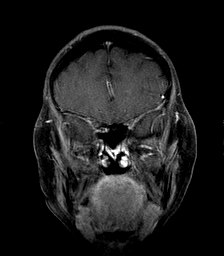
[im 13/26]
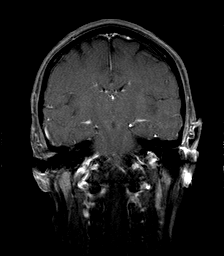
[im 19/26]
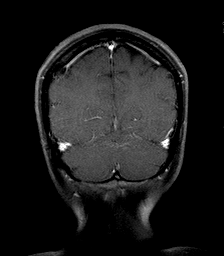
[im 26/26]
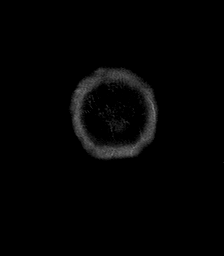

[46 of 48 positions shown; findings below may reference images not displayed]

08/30/21

EXAM

MRI brain without and with contrast

INDICATION

possible TIA
PT PRESENTED TO ER LAST NIGHT BY EMS FOR INCREASED BP, DIZZINESS, ACUTE ONSET HEADACHE, NAUSEA,
BLURRY VISION, "JUST NOT FEELING RIGHT." SYMPTOMS IMPROVED EXCEPT HEADACHE.  15 ML GADAVIST RG

FINDINGS

Sagittal T1 and axial T1, T2, FLAIR, gradient echo and diffusion weighted images of the brain were
obtained. Axial and coronal post-contrast T1 weighted images were obtained after IV dose of 15 mL of
Gadovist.

There is no restricted diffusion.

There is a small focal area of increased signal intensity within the deep white matter of the right
frontal lobe on the FLAIR and T2 weighted images. This measures less than 5 millimeters in diameter.

No other areas of signal abnormality are identified within the cerebral hemispheres.

The brainstem and cerebellum appear normal.

There is no enhancing lesion. There is no mass effect or midline shift. There is no hydrocephalus.

There is mucosal thickening throughout the ethmoid sinuses.

IMPRESSION

There is no acute appearing MRI abnormality of the brain. There is a small focal areas signal
abnormality in the right frontal lobe white matter most likely representing a chronic small vessel
white matter infarct or small area of gliosis. No acute infarct is identified. There is no mass
lesion or midline shift.

There is evidence of chronic ethmoid sinusitis.

Tech Notes:

PT PRESENTED TO ER LAST NIGHT BY EMS FOR INCREASED BP, DIZZINESS, ACUTE ONSET HEADACHE, NAUSEA,
BLURRY VISION, "JUST NOT FEELING RIGHT." SYMPTOMS IMPROVED EXCEPT HEADACHE.  15 ML GADAVIST RG

## 2021-09-01 IMAGING — US ADUPLEBI
1 series · 14 of 16 positions shown · non-contrast
Comparison: none

[Series 1: us arterial duplex low ext bi · arterial · 14 of 56 slices shown]
[im 1/56]
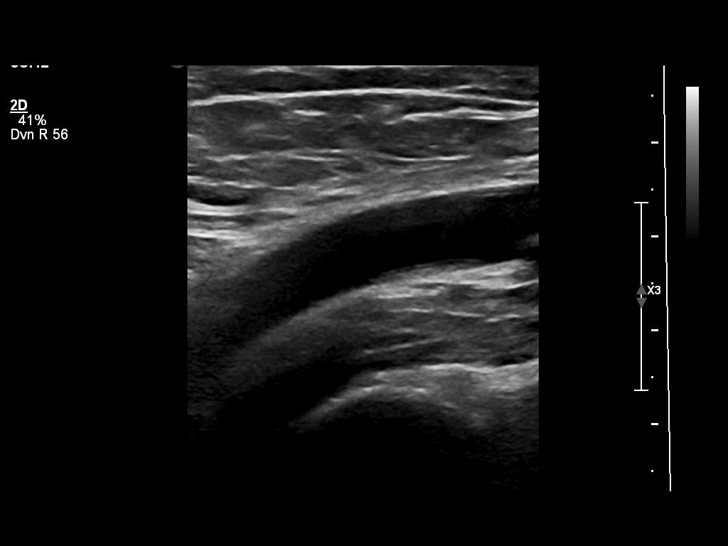
[im 4/56]
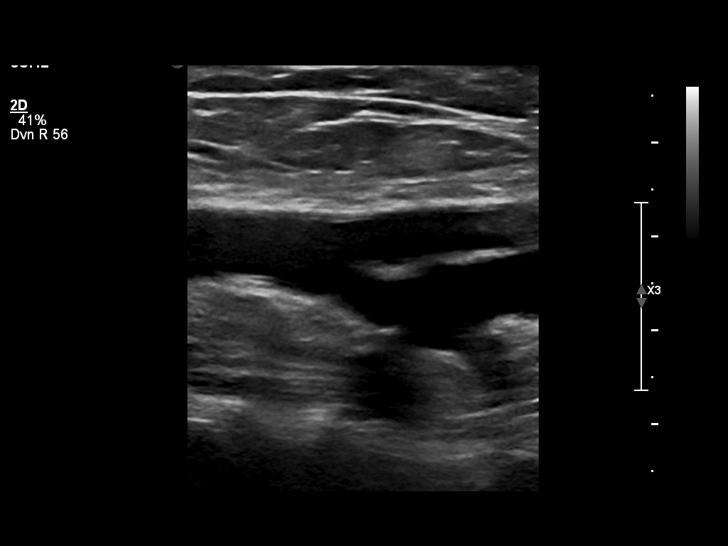
[im 8/56]
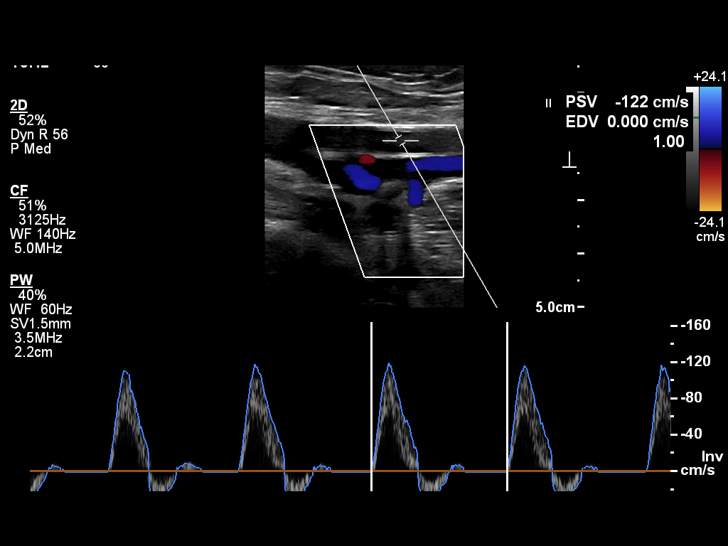
[im 15/56]
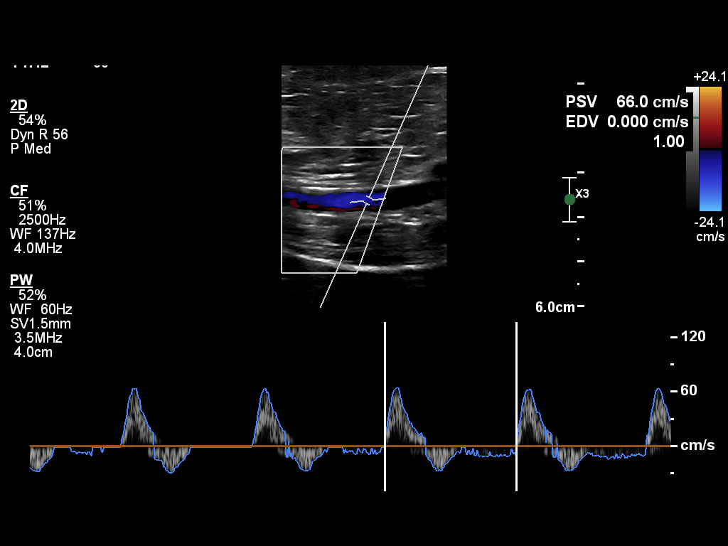
[im 19/56]
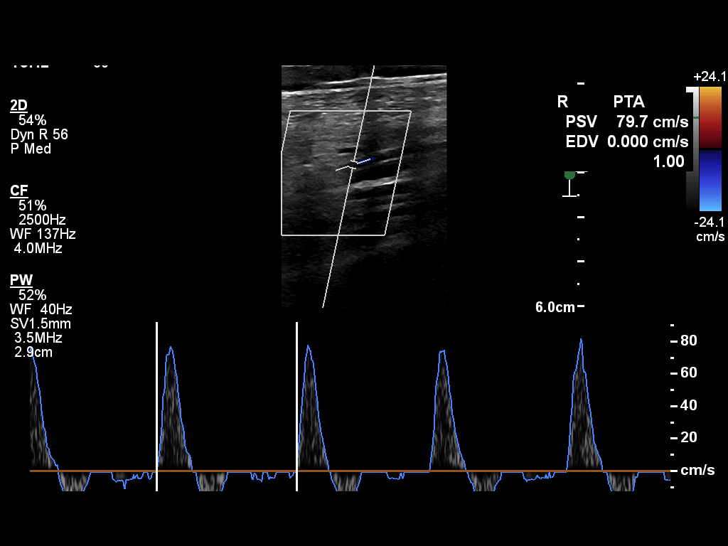
[im 23/56]
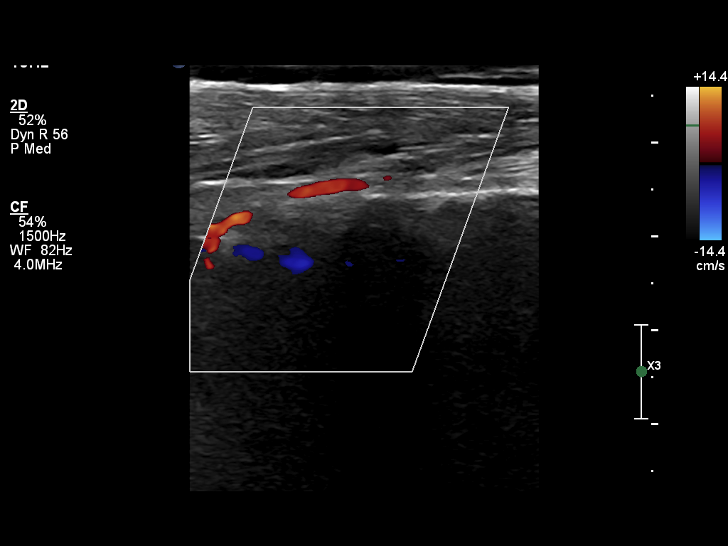
[im 26/56]
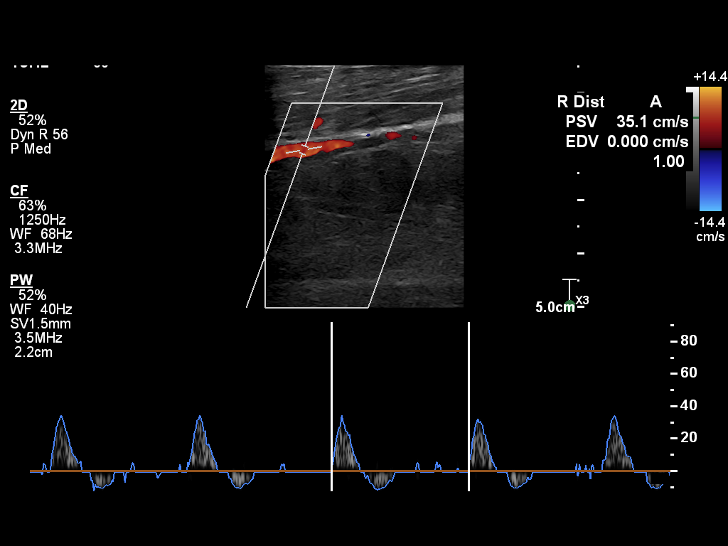
[im 30/56]
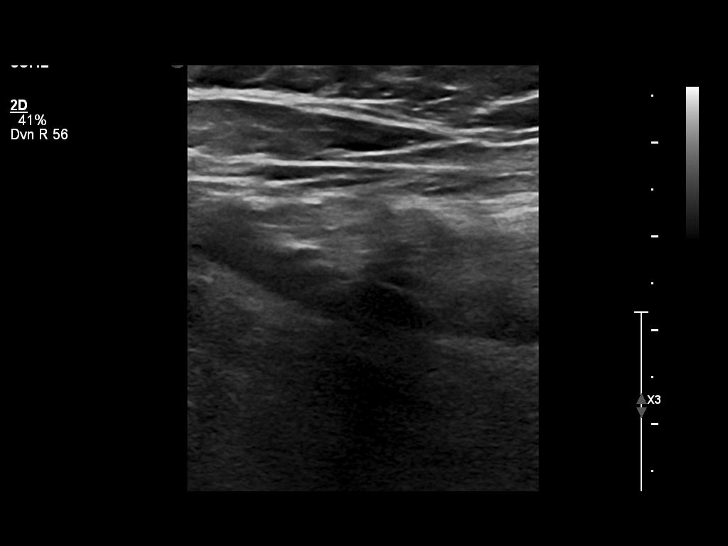
[im 34/56]
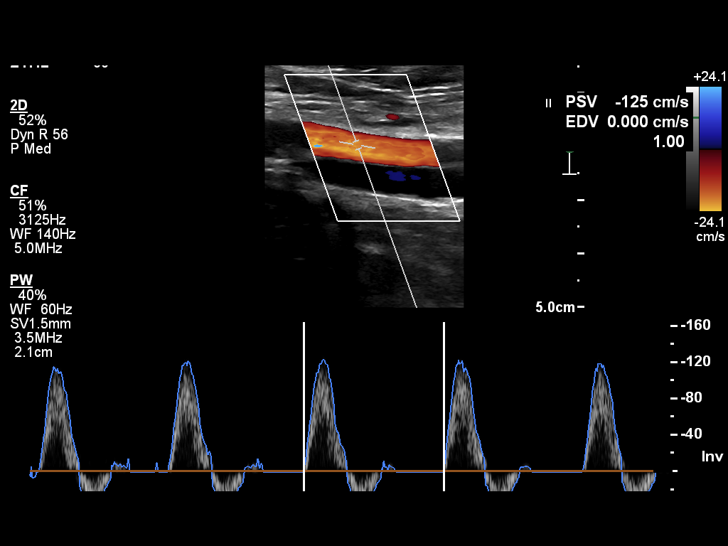
[im 37/56]
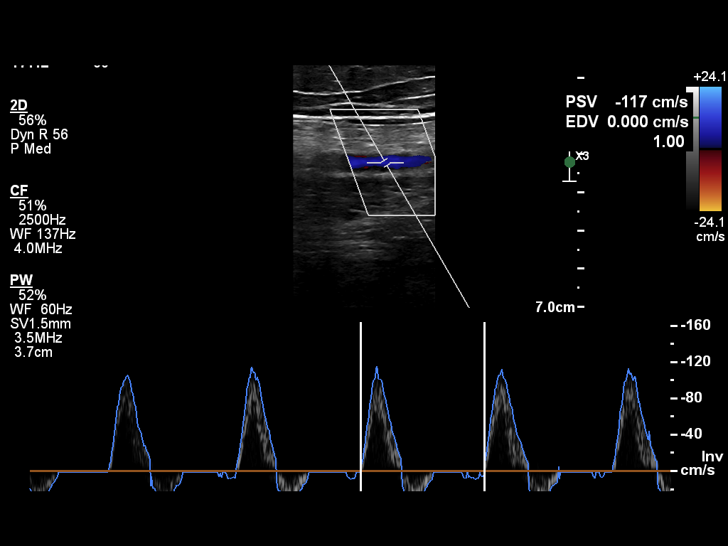
[im 45/56]
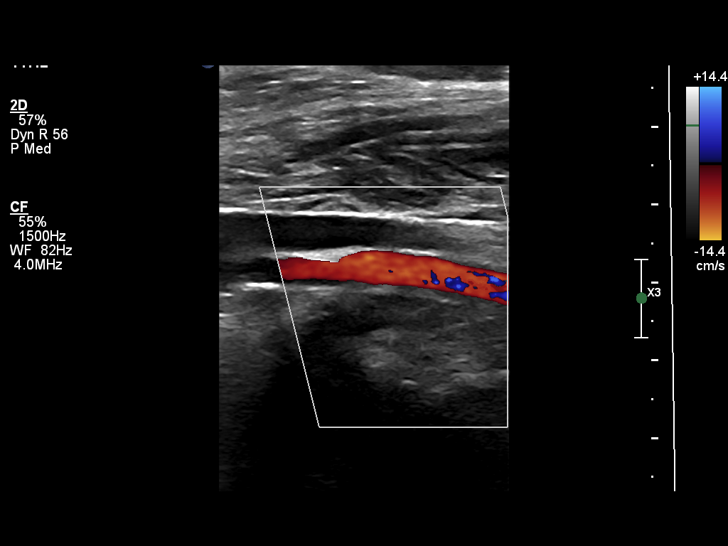
[im 48/56]
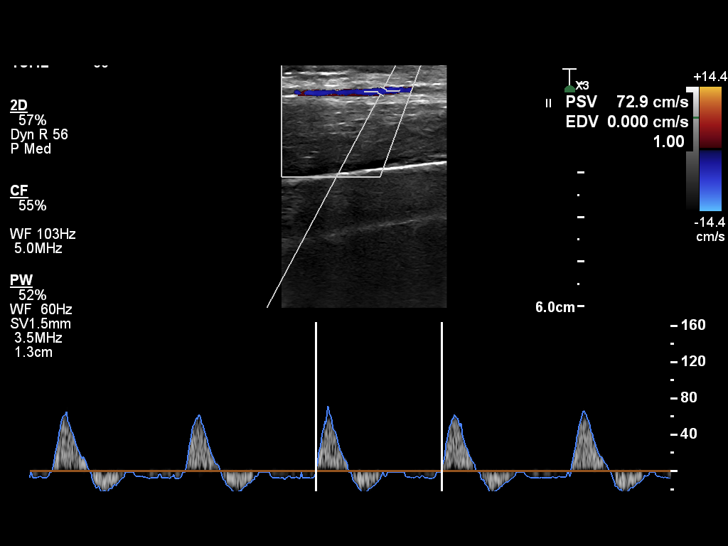
[im 52/56]
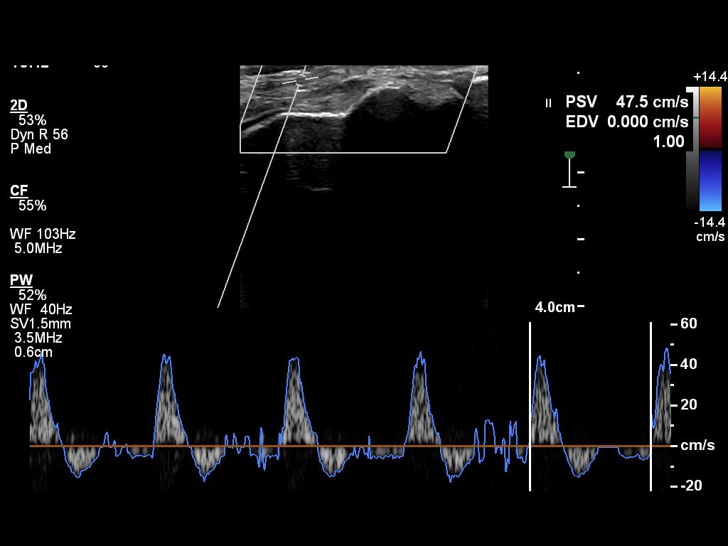
[im 56/56]
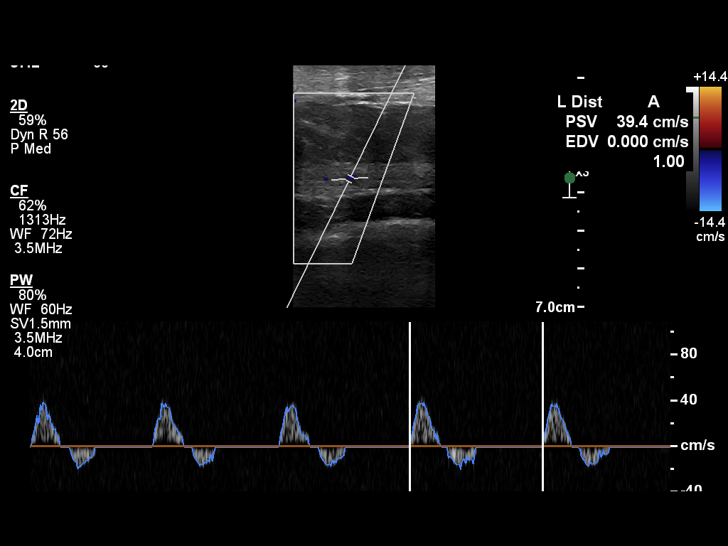

[14 of 16 positions shown; findings below may reference images not displayed]

09/01/21

Onacram, Don Lolito

DIAGNOSTIC STUDIES

EXAM

Bilateral lower extremity arterial Doppler ultrasound.

INDICATION

bilateral leg pains and neuropathy

TECHNIQUE

High-resolution duplex imaging the lower extremities bilaterally was obtained. This includes
color, Doppler, and spectral analysis.

COMPARISONS

None available

FINDINGS

There is triphasic flow involving the right common femoral and superficial femoral arteries to its
midportion. Biphasic flow is noted throughout the remaining right lower extremity. No elevated
systolic velocities are seen to indicate underlying arterial stenosis.

Left lower extremity to also demonstrates triphasic flow within the left common femoral and
proximal left superficial femoral arteries. Biphasic flow is noted within the mid left superficial
femoral artery throughout the remaining left lower extremity without evidence for elevated systolic
velocities to indicate stenosis.

IMPRESSION

No evidence for arterial stenosis throughout either lower extremity.

Tech Notes:

## 2021-09-01 IMAGING — MR C-spine^Routine
5 series · 42 of 48 positions shown · non-contrast
Comparison: none

[Series 2: T2 · sagittal · 3.0mm · 0.54mm/px · 8 of 13 slices shown]
[im 1/13]
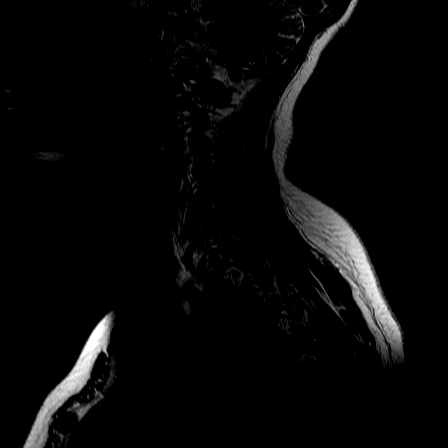
[im 2/13]
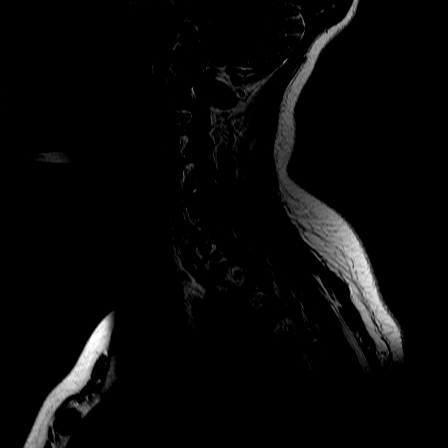
[im 4/13]
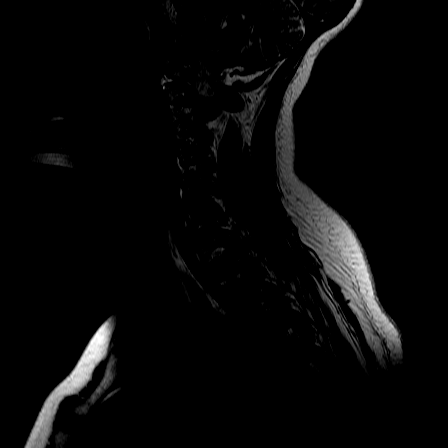
[im 6/13]
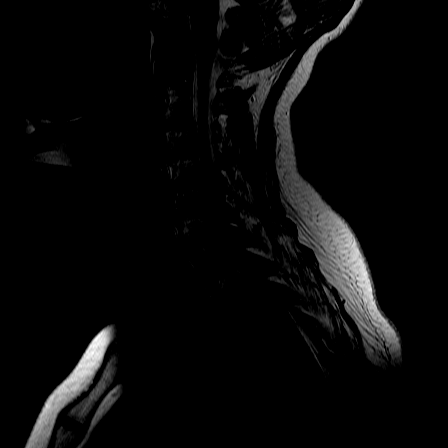
[im 7/13]
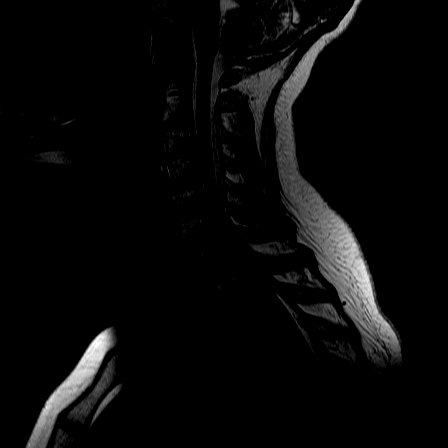
[im 9/13]
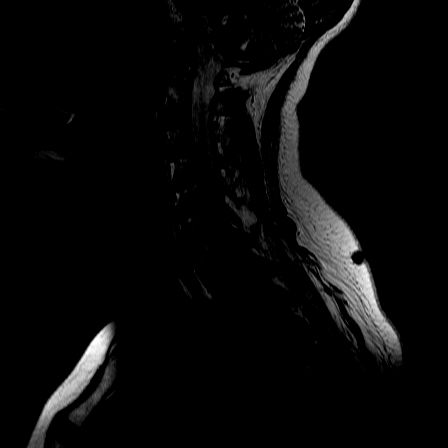
[im 11/13]
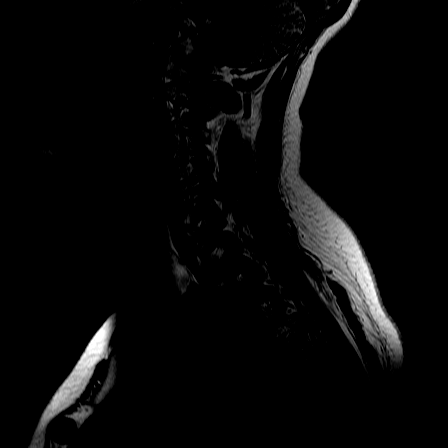
[im 13/13]
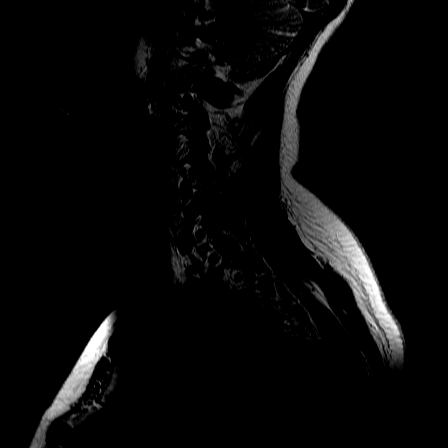

[Series 3: T1 · sagittal · 3.0mm · 0.75mm/px · 7 of 13 slices shown (1 of 2)]
[im 1/13]
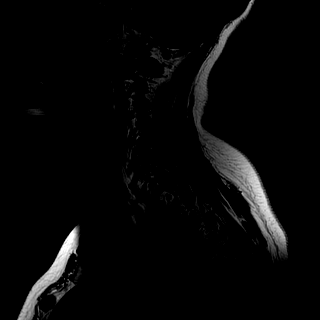
[im 3/13]
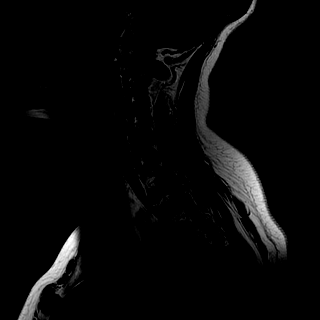
[im 5/13]
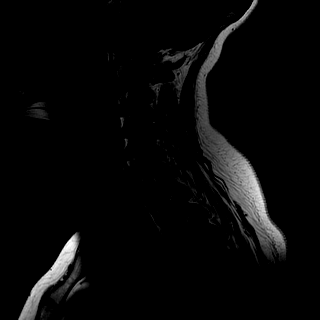
[im 7/13]
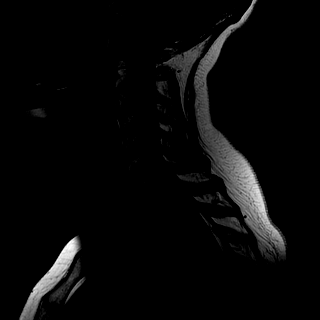
[im 9/13]
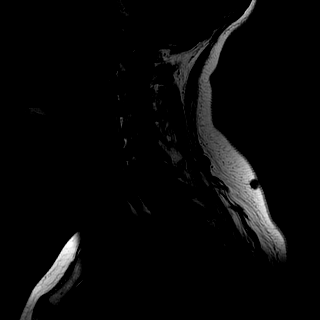
[im 11/13]
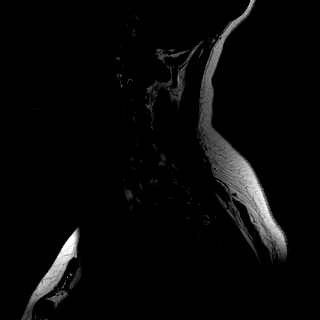
[im 13/13]
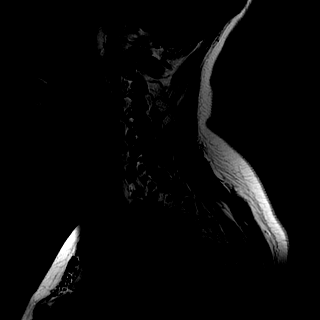

[Series 4: STIR · sagittal · 3.0mm · 0.94mm/px · 7 of 13 slices shown]
[im 1/13]
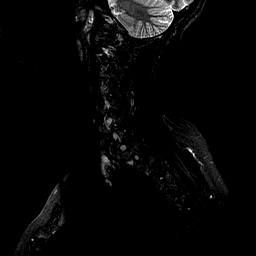
[im 3/13]
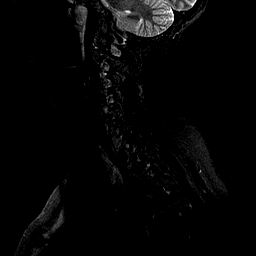
[im 5/13]
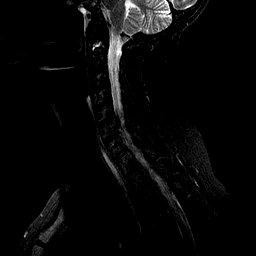
[im 7/13]
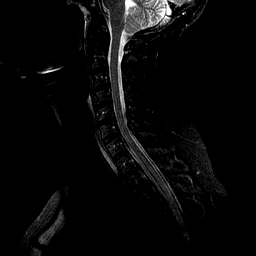
[im 9/13]
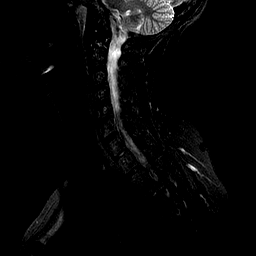
[im 11/13]
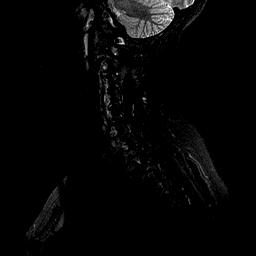
[im 13/13]
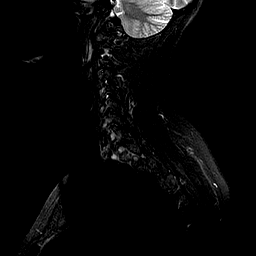

[Series 5: provider echo axial · axial · 3.0mm · 0.39mm/px · z∈[-32,+55]mm · 8 of 24 slices shown]
[im 1/24]
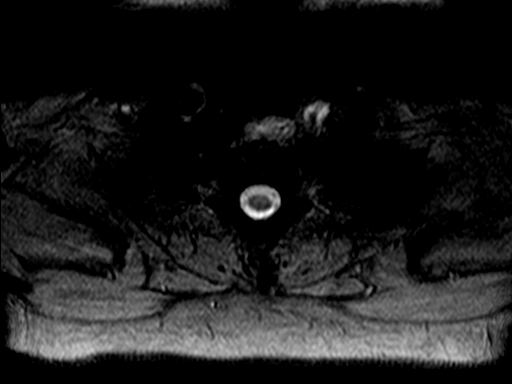
[im 4/24]
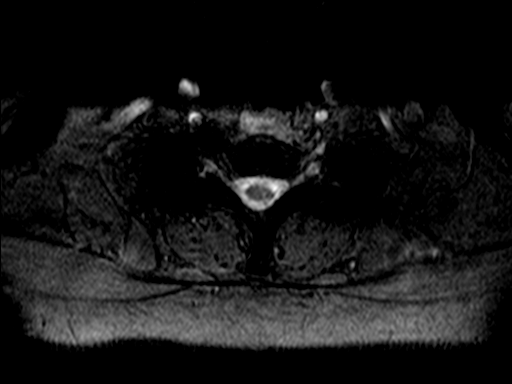
[im 8/24]
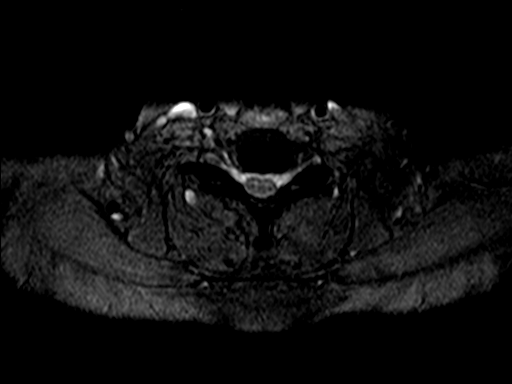
[im 10/24]
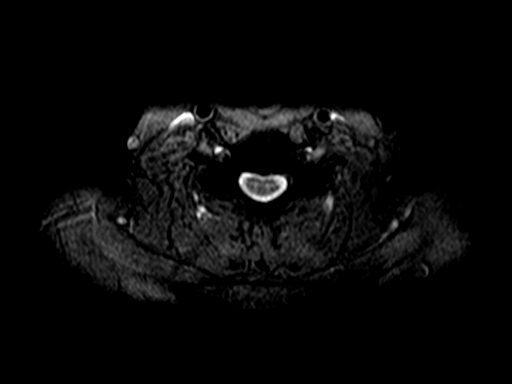
[im 14/24]
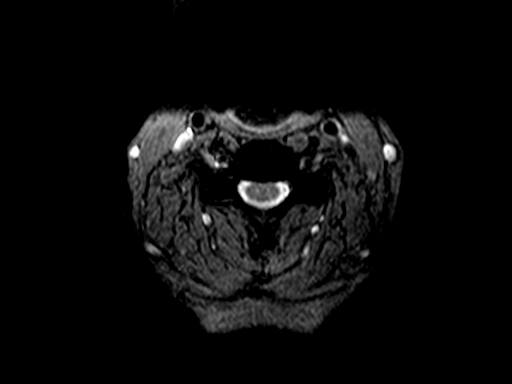
[im 16/24]
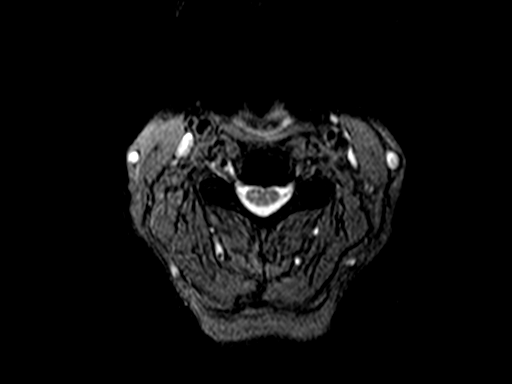
[im 20/24]
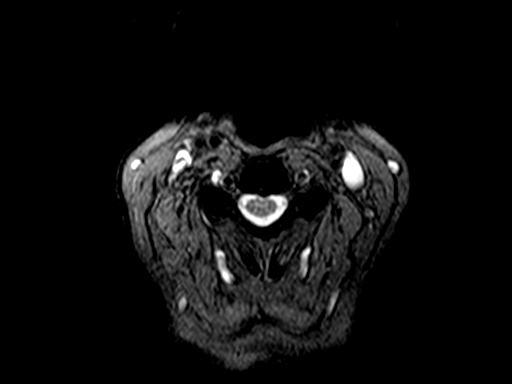
[im 24/24]
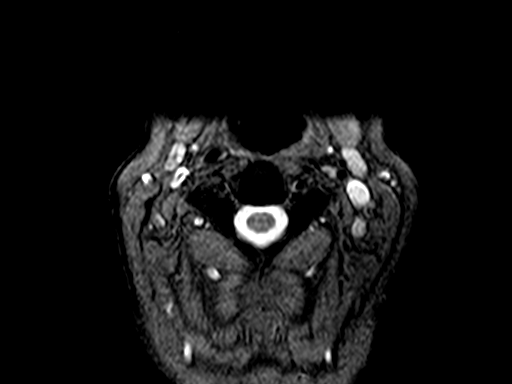

[Series 6: T1 · axial · 3.0mm · 0.78mm/px · z∈[-32,+55]mm · 12 of 24 slices shown (2 of 2)]
[im 1/24]
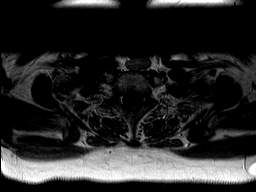
[im 2/24]
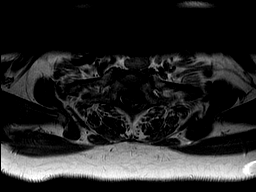
[im 4/24]
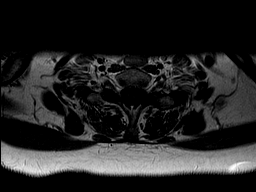
[im 6/24]
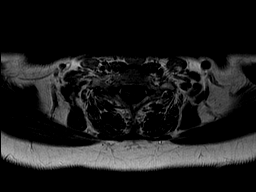
[im 8/24]
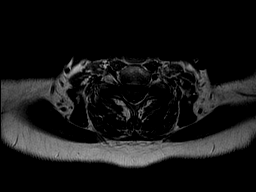
[im 10/24]
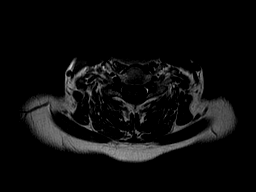
[im 12/24]
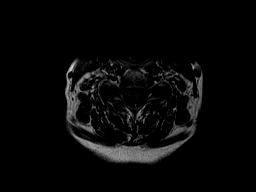
[im 14/24]
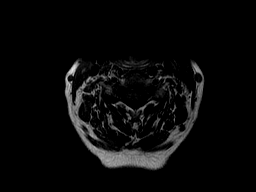
[im 16/24]
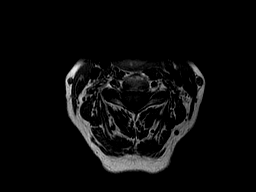
[im 18/24]
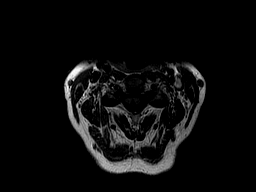
[im 20/24]
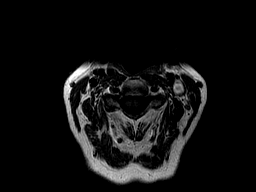
[im 24/24]
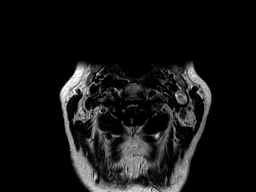

[42 of 48 positions shown; findings below may reference images not displayed]

09/01/21

Tiger, Rox

EXAM

MR cervical spine wo con

INDICATION

Pain

TECHNIQUE

Multiplanar, multisequence imaging of the cervical spine without contrast.

COMPARISONS

None available at the time of dictation.

FINDINGS

ANATOMY: Normal cervical lordosis. No spondylolisthesis.

VERTEBRAL BODIES: No endplate compression fracture. No significant endplate degenerative change.

C2-C3: Unremarkable.

C3-C4: Unremarkable.

C4-C5: Unremarkable

C5-C6: Minimally decreased intervertebral disc space with a small disc bulge. There is moderate
left and mild right neural foraminal stenosis. No spinal canal stenosis.

C6-C7: A small concentric disc bulge is present. No neural foraminal stenosis is seen. There is
mild-to-moderate spinal canal stenosis with effacement of the ventral thecal sac.

C7-T1: Unremarkable.

IMPRESSION
1. No acute findings.
2. Multilevel degenerative changes resulting in up to moderate neural foraminal and mild to
moderate spinal canal stenosis as detailed above.

Tech Notes:

LOWER LEG PAIN WITH NEUROPATHY X 1 MONTH.  RG

## 2021-09-01 IMAGING — CT ABDOMEN_PELVIS W(Adult)
2 of 3 series · 12 of 46 positions shown, 14 images · non-contrast
Comparison: none

[Series 2: abdomen_pelvis ax 3.00 br40 s3 · axial · 0.51mm/px · z∈[+1202,+1589]mm · 9 of 148 slices shown, 11 images]
[im 10/148  soft-tissue]
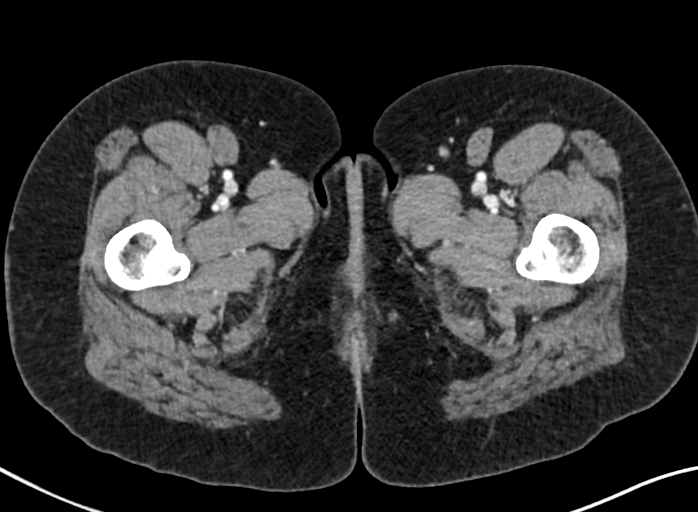
[im 10/148  bone]
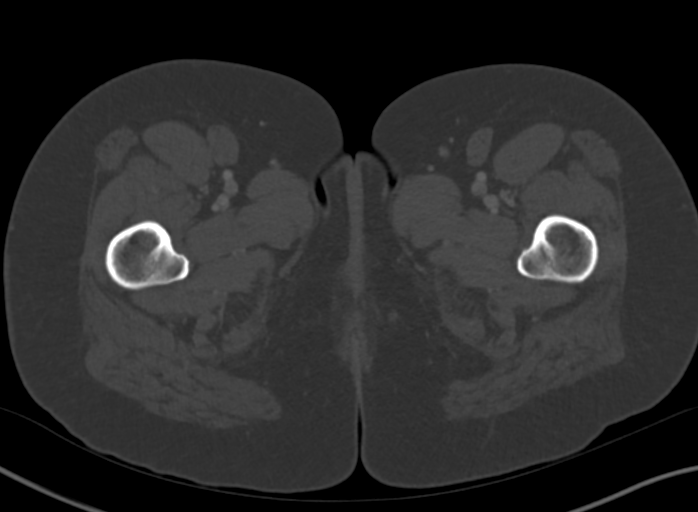
[im 29/148  soft-tissue]
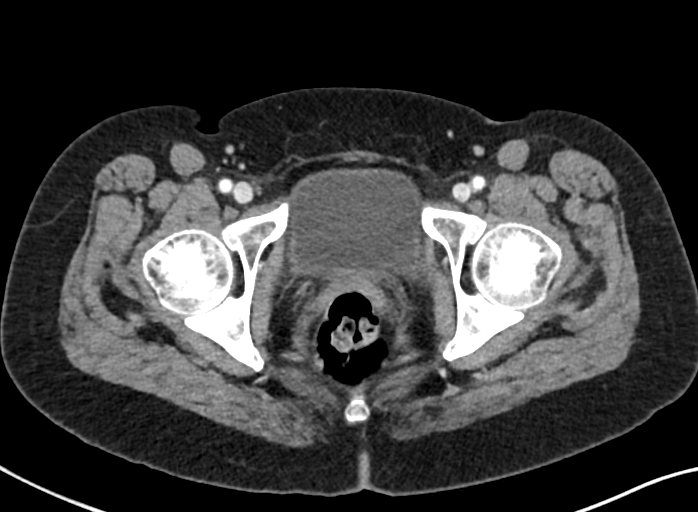
[im 43/148  soft-tissue]
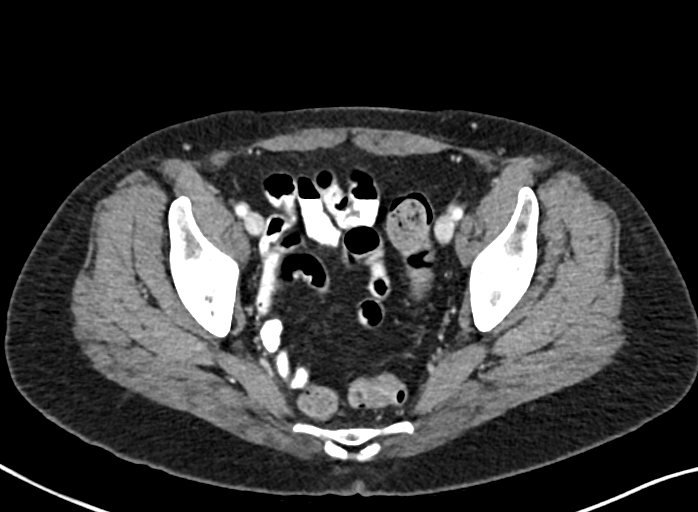
[im 57/148  soft-tissue]
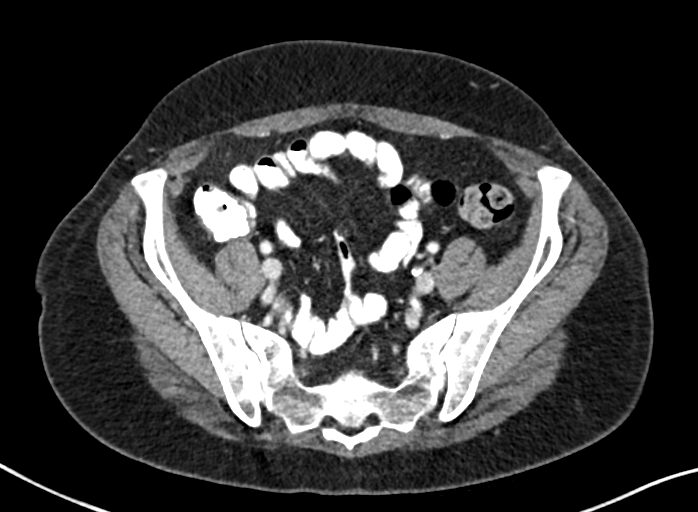
[im 76/148  soft-tissue]
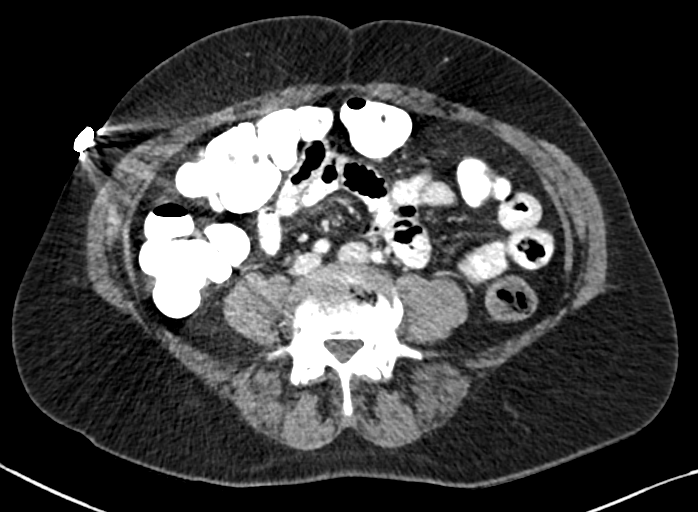
[im 91/148  soft-tissue]
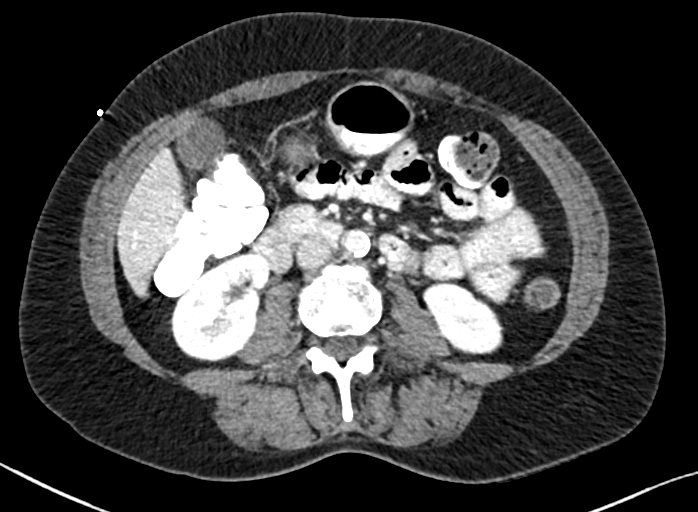
[im 105/148  soft-tissue]
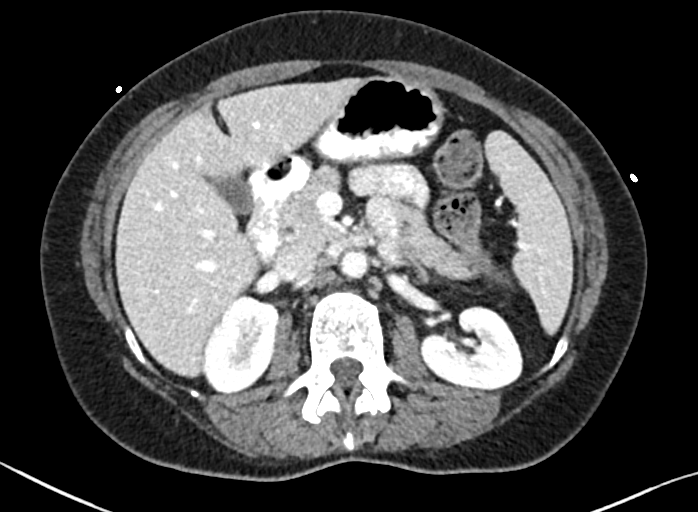
[im 124/148  soft-tissue]
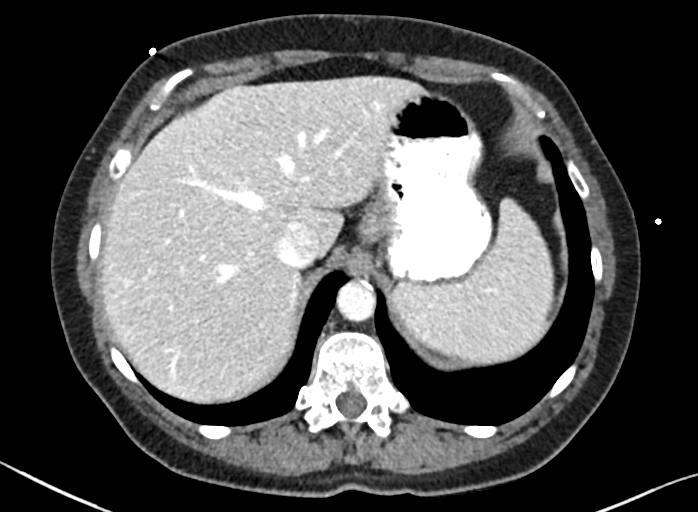
[im 138/148  soft-tissue]
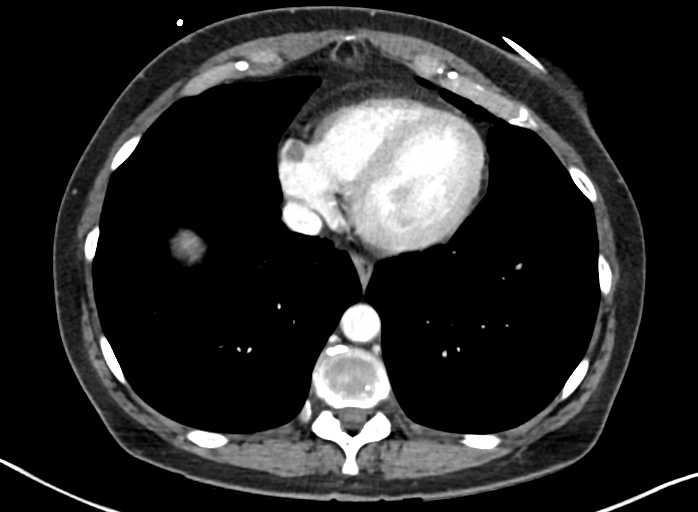
[im 138/148  bone]
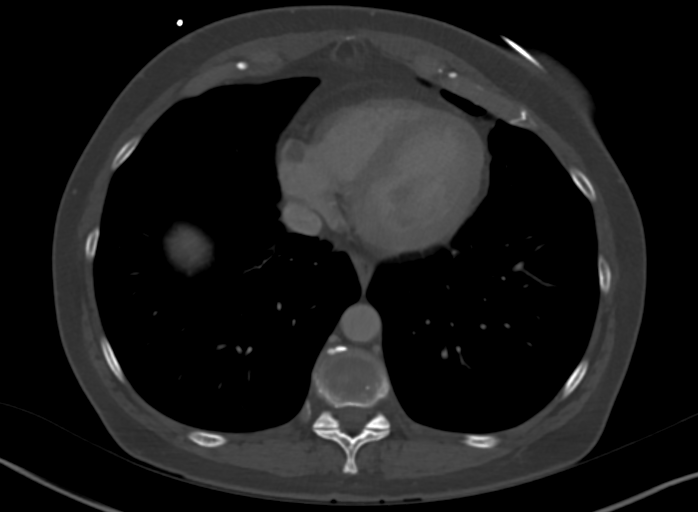

[Series 4: abdomen_pelvis cor 3.00 br40 s3 · coronal · 0.70mm/px · 3 of 84 slices shown]
[im 28/84  soft-tissue]
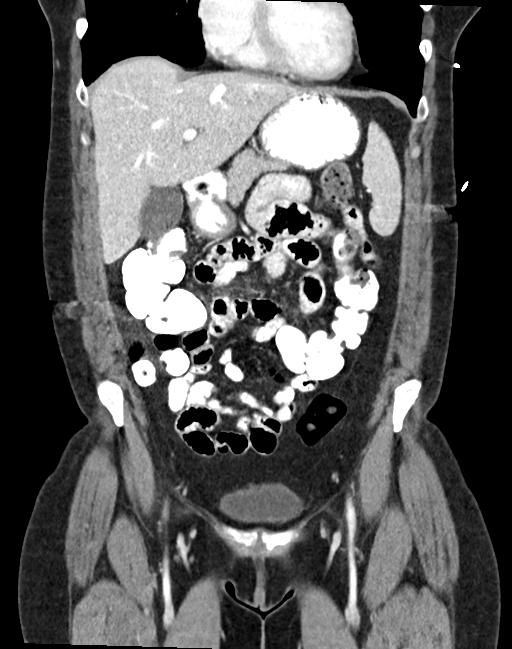
[im 37/84  soft-tissue]
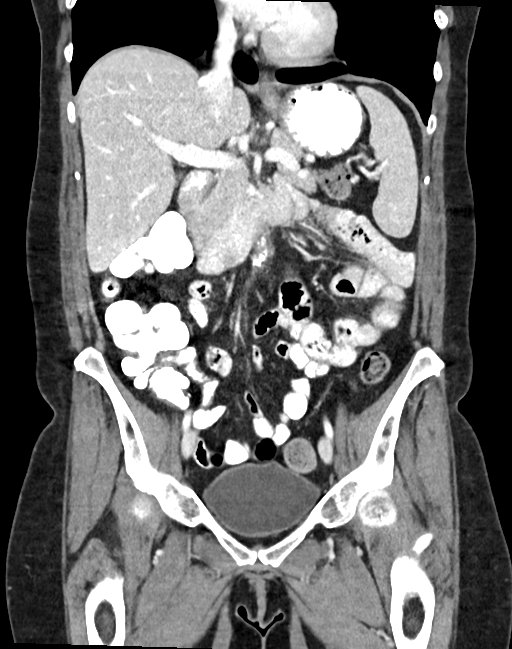
[im 47/84  soft-tissue]
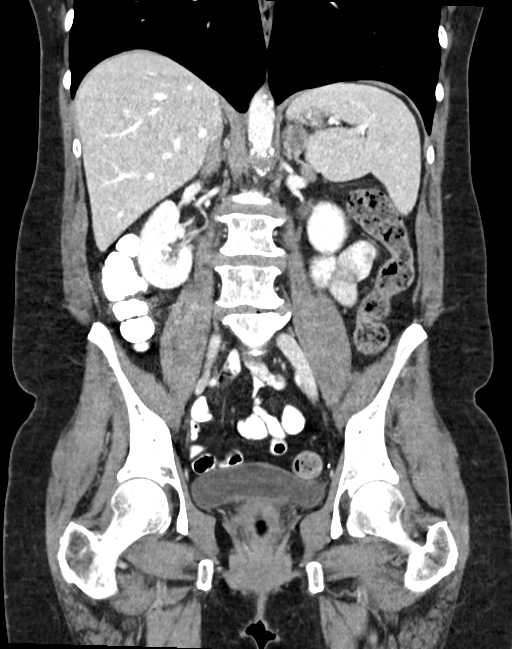

[12 of 46 positions shown; findings below may reference images not displayed]

09/01/21

Shekularac, Doo Prizma Mont

DIAGNOSTIC STUDIES

EXAM

CT abdomen pelvis with contrast

INDICATION

1 month of bilateral leg pains and new neuropathy
PT C/O PAIN DOWN BILAT LOWER EXTREMITIES X 1 MO.  R/O TUMORS/CYSTS.  CREAT = 0.84, GFR= 65.8.  100
ML OMNIPAQUE 300 GIVEN.  AB  CT/NM: 1/0

TECHNIQUE

All CT scans at this facility use dose modulation, iterative reconstruction, and/or weight based
dosing when appropriate to reduce radiation dose to as low as reasonably achievable.

Number of previous computed tomography exams in the last 12 months is 1.

Number of previous nuclear medicine myocardial perfusion studies in the last 12 months is 0  .

COMPARISONS

None available

FINDINGS

Mild bronchial wall thickening is seen in the lung bases which may indicate acute or chronic
bronchitis.

Liver, gallbladder, and spleen are normal.

Right adrenal gland is normal. 2.1 cm hypodensity on the left adrenal gland is noted image 34
series [DATE] hypodensity measures 1.5 cm on the lateral limb left adrenal gland image 41 series
2. These likely represent adrenal adenomas. Kidneys are normal. Pancreas is unremarkable.

No retroperitoneal adenopathy is seen. Scattered calcified plaque is noted throughout the abdominal
pelvic vasculature.

Small and large bowel are normal in caliber. No ascites or mesenteric adenopathy. No pelvic
adenopathy is seen. Prior hysterectomy. No concerning osseous lesions are seen.

IMPRESSION

Hypodensities on the left adrenal gland likely reflecting adenomas. These are not completely
characterized. Triple phase adrenal protocol CT abdomen is recommended. This should be obtained
after several days to allow current contrast to be excreted.

Scattered calcified plaque and mild bronchial wall thickening in the lung bases.

Tech Notes:

PT C/O PAIN DOWN BILAT LOWER EXTREMITIES X 1 MO.  R/O TUMORS/CYSTS.  CREAT = 0.84, GFR= 65.8.  100
ML OMNIPAQUE 300 GIVEN.  AB
CT/NM: 1/0

## 2022-01-20 IMAGING — CT BRAIN WO(Adult)
3 of 4 series · 14 of 47 positions shown, 16 images · non-contrast
Comparison: none

[Series 4: brain cor 5.00 hr40 s3 · coronal · 0.29mm/px · 3 of 40 slices shown]
[im 14/40  brain]
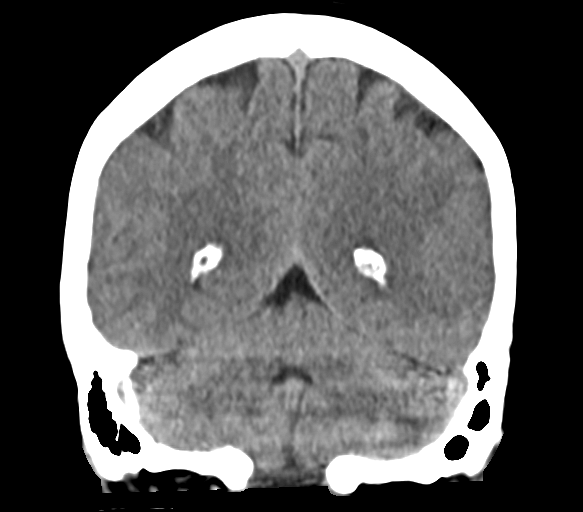
[im 18/40  brain]
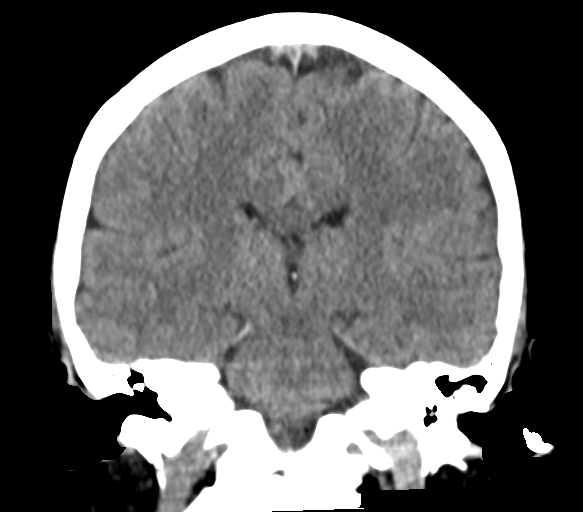
[im 22/40  brain]
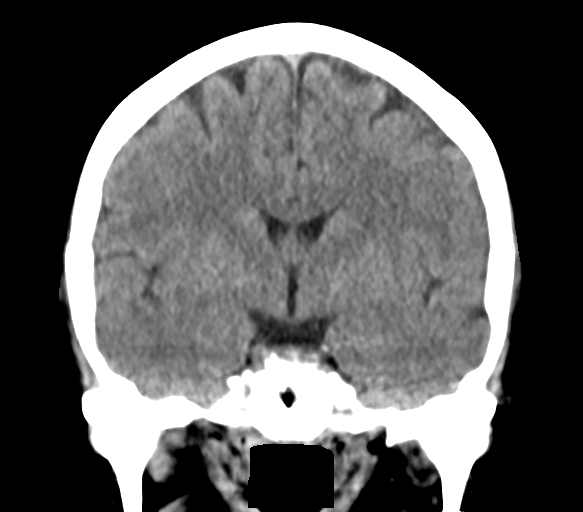

[Series 6: brain sag 5.00 hr40 s3 · sagittal · 0.29mm/px · 3 of 34 slices shown]
[im 12/34  brain]
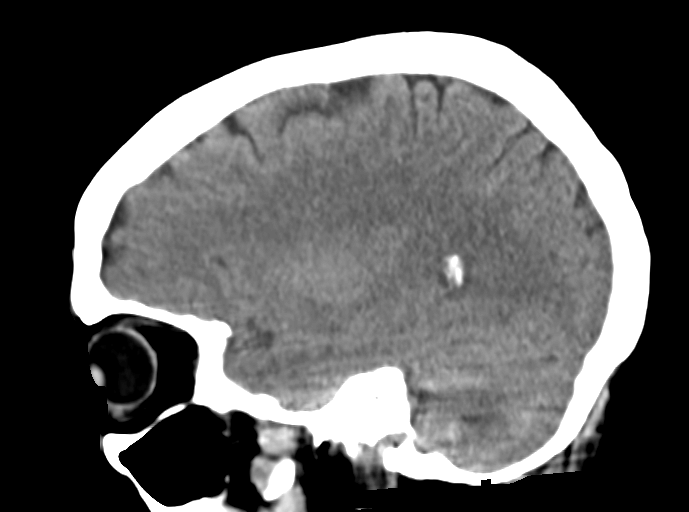
[im 17/34  brain]
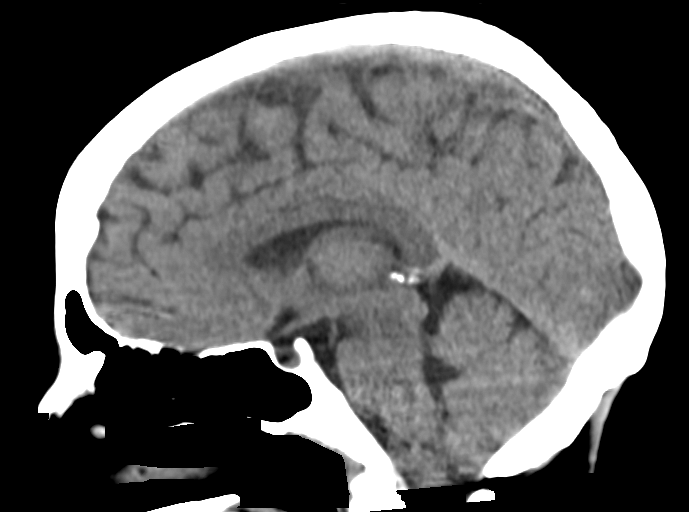
[im 23/34  brain]
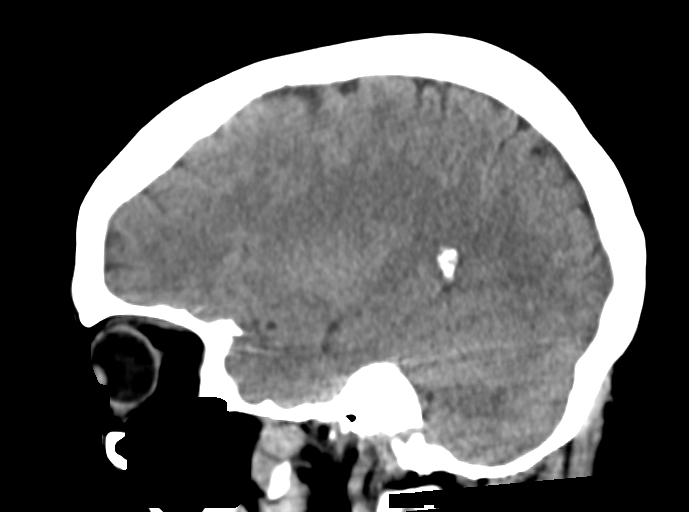

[Series 8: brain ax 2.00 hr60 s3 · axial · 0.33mm/px · z∈[-486,-370]mm · 8 of 73 slices shown, 10 images]
[im 8/73  brain]
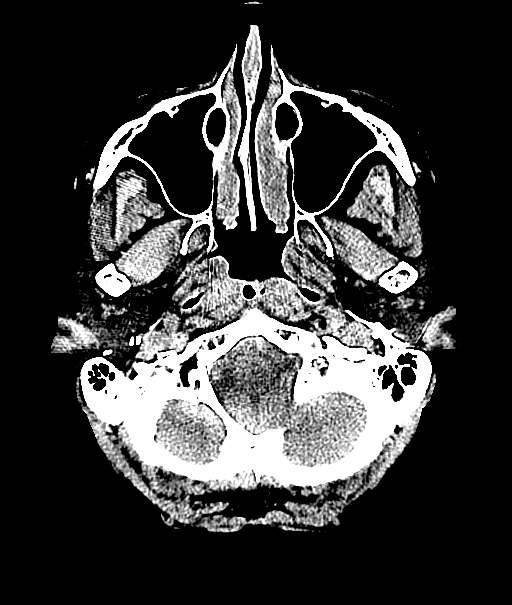
[im 8/73  bone]
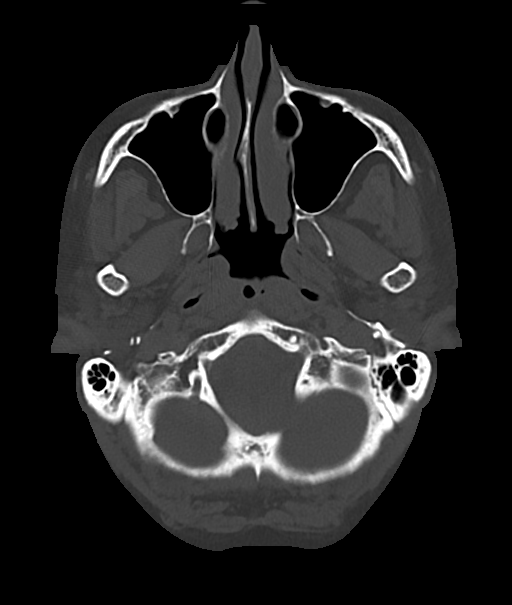
[im 15/73  brain]
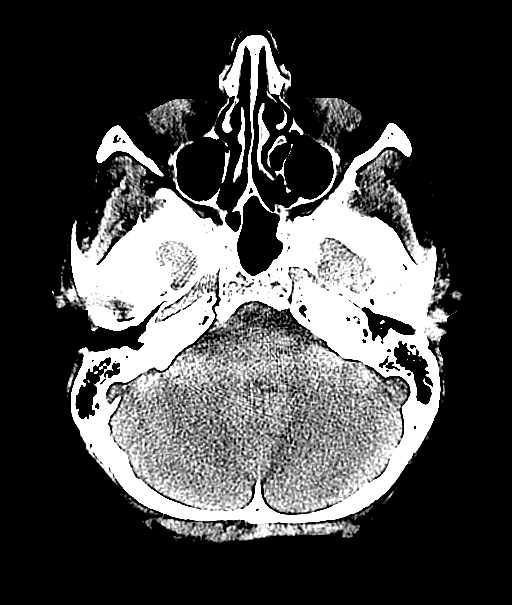
[im 22/73  brain]
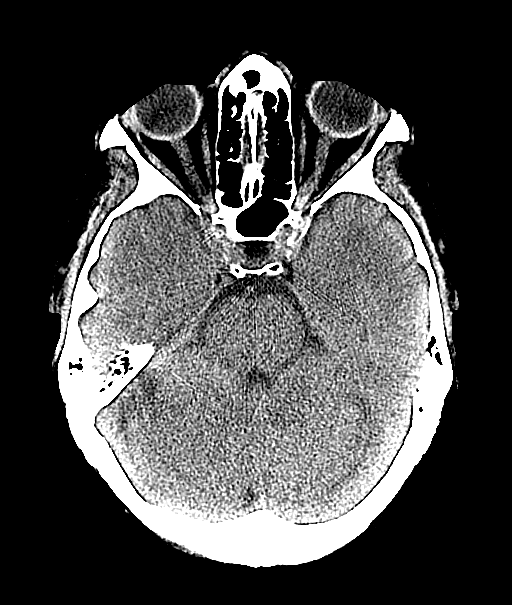
[im 33/73  brain]
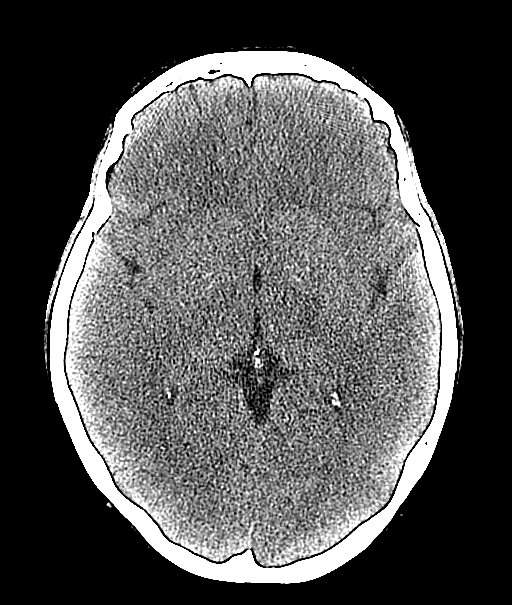
[im 40/73  brain]
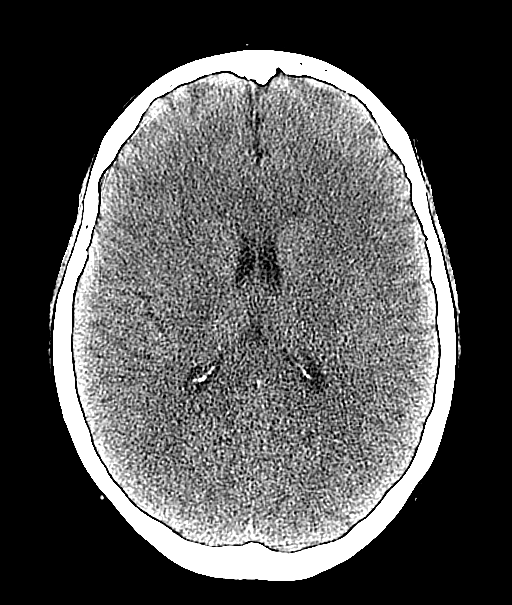
[im 40/73  bone]
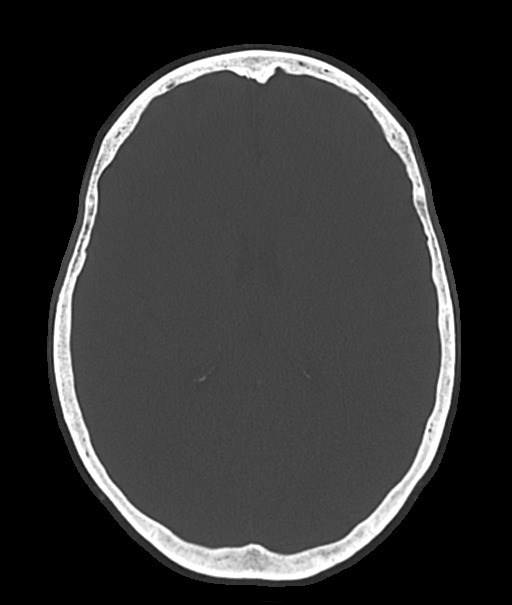
[im 51/73  brain]
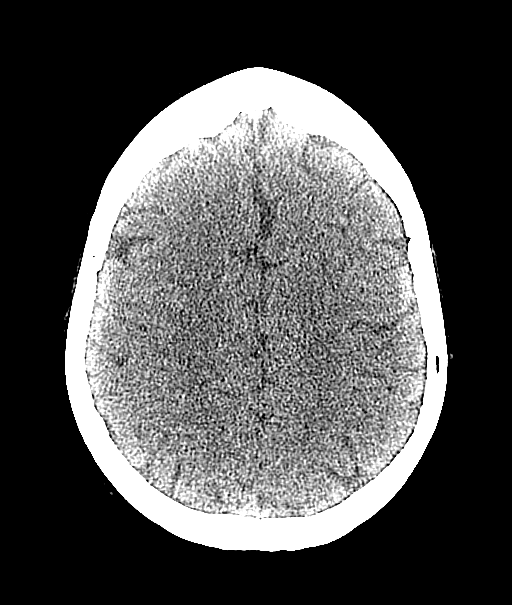
[im 58/73  brain]
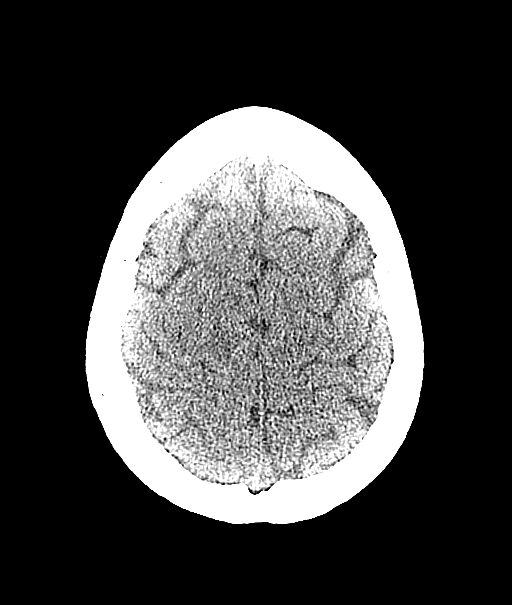
[im 65/73  brain]
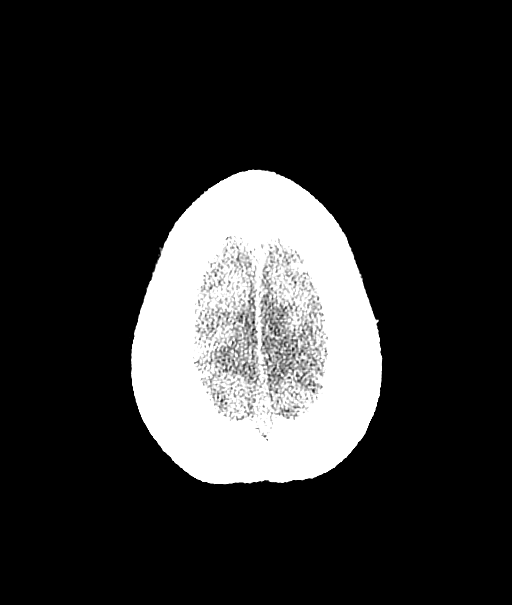

[14 of 47 positions shown; findings below may reference images not displayed]

01/20/22

EXAM

CT HEAD

INDICATION

Dizziness, confusion
PT C/O SYNCOPAL EPISODE X FEW DAYS AGO. DIZZINESS. CT/NM 0/0. TJ

TECHNIQUE

All CT scans at this facility use dose modulation, iterative reconstruction, and/or weight based
dosing when appropriate to reduce radiation dose to as low as reasonably achievable.

CT of the head without contrast was performed. # of CT scans in the past year: 1 # of Myocardial
perfusion scans this past year: 0

COMPARISONS

08/30/21

FINDINGS

Parenchyma: No acute hemorrhage. There is no mass effect, midline shift, or herniation. There is
preservation of the gray white differentiation.

Ventricles / Extra-axial spaces: There is no hydrocephalus. There are no extra-axial fluid
collections.

Other: The bony structures are intact. Visualized portions of the paranasal sinuses and mastoid air
cells are clear.

IMPRESSION
1. [No CT evidence of an acute intracranial abnormality.]

Tech Notes:

PT C/O SYNCOPAL EPISODE X FEW DAYS AGO. DIZZINESS. CT/NM 0/0. TJ

## 2022-03-10 IMAGING — CT BRAIN WO(Adult)
3 of 4 series · 14 of 47 positions shown, 16 images · non-contrast
Comparison: No relevant prior studies available.

03/10/22

DIAGNOSTIC STUDIES
EXAM:  CT HEAD WITHOUT INTRAVENOUS CONTRAST  ([id number])
INDICATION: stroke Hypertension with history of TIA. CT/NM 3/0. TB
TECHNIQUE: Axial computed tomography images of the head/brain without intravenous contrast.
Sagittal and coronal reformatted images were created and reviewed.

[Series 4: brain cor 5.00 hr40 s3 · coronal · 0.33mm/px · 3 of 41 slices shown]
[im 14/41  brain]
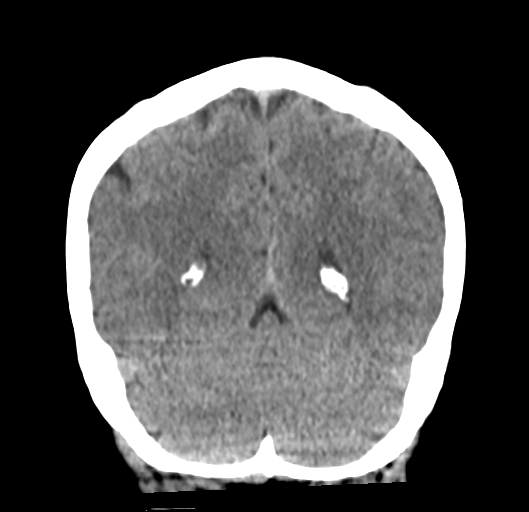
[im 18/41  brain]
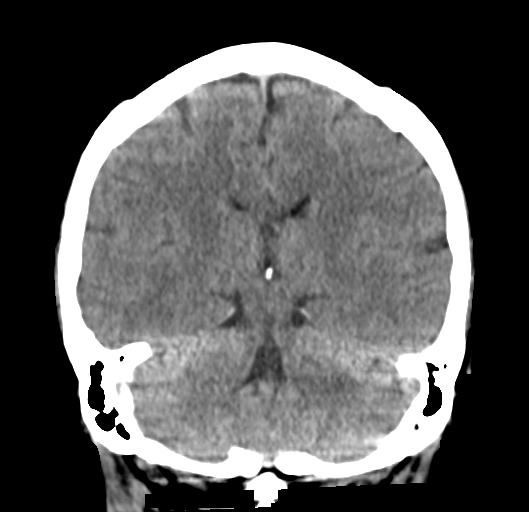
[im 23/41  brain]
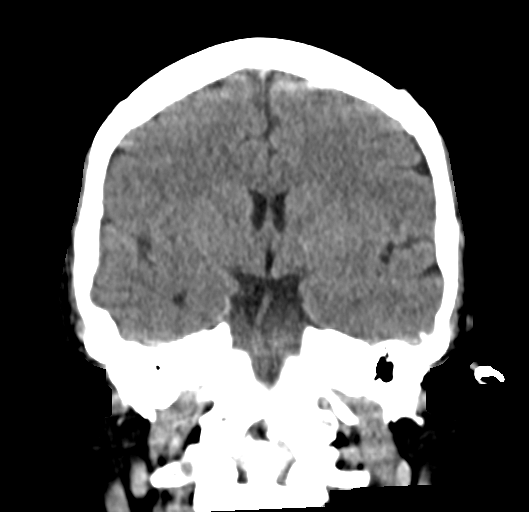

[Series 6: brain sag 5.00 hr40 s3 · sagittal · 0.33mm/px · 3 of 35 slices shown]
[im 12/35  brain]
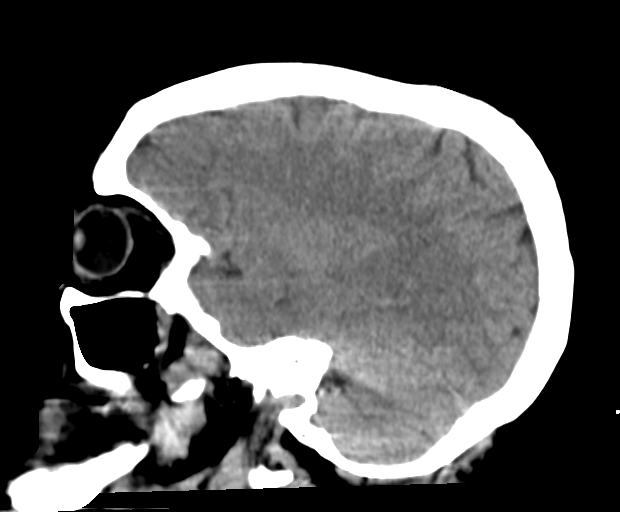
[im 18/35  brain]
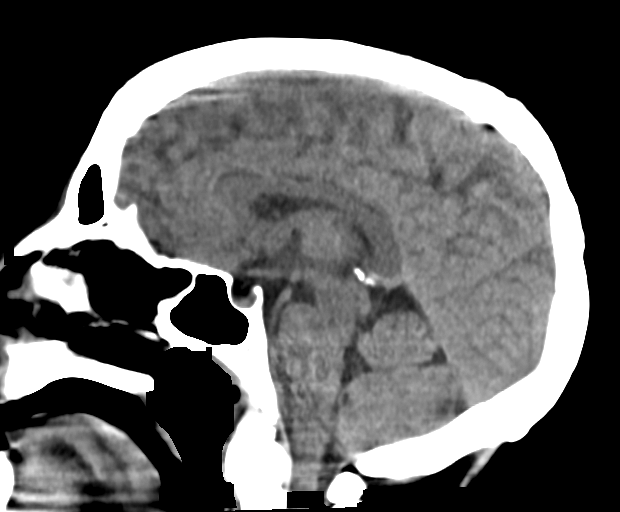
[im 23/35  brain]
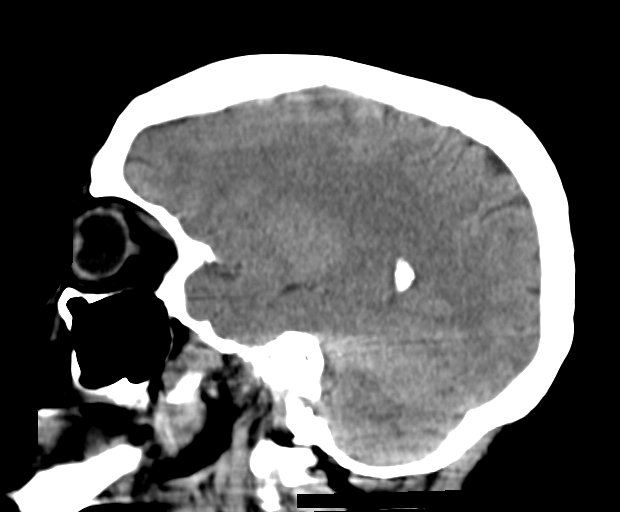

[Series 8: brain ax 2.00 hr60 s3 · axial · 0.34mm/px · z∈[-530,-389]mm · 8 of 89 slices shown, 10 images]
[im 9/89  brain]
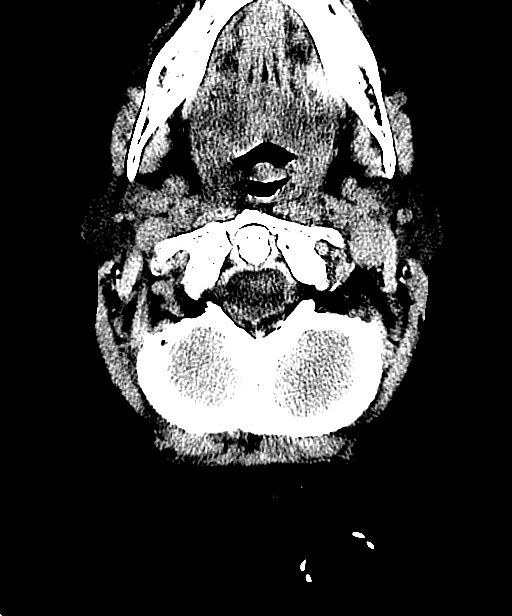
[im 9/89  bone]
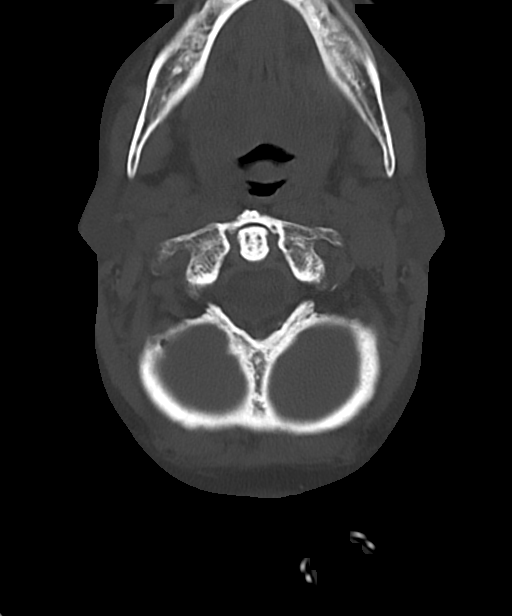
[im 17/89  brain]
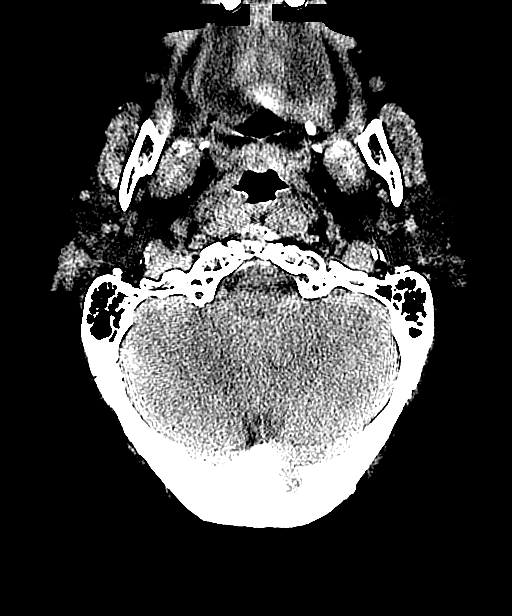
[im 30/89  brain]
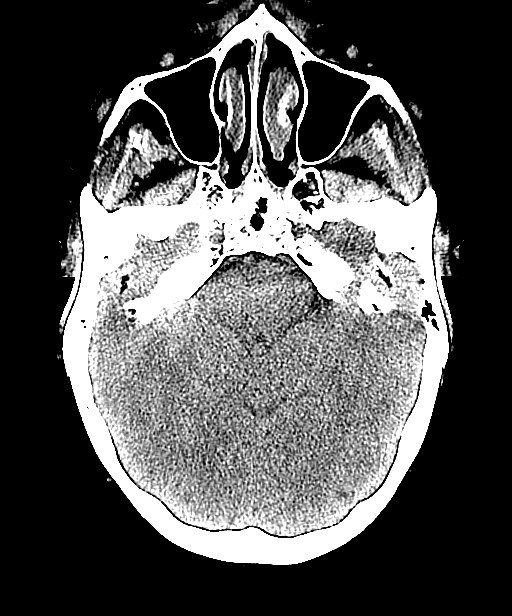
[im 38/89  brain]
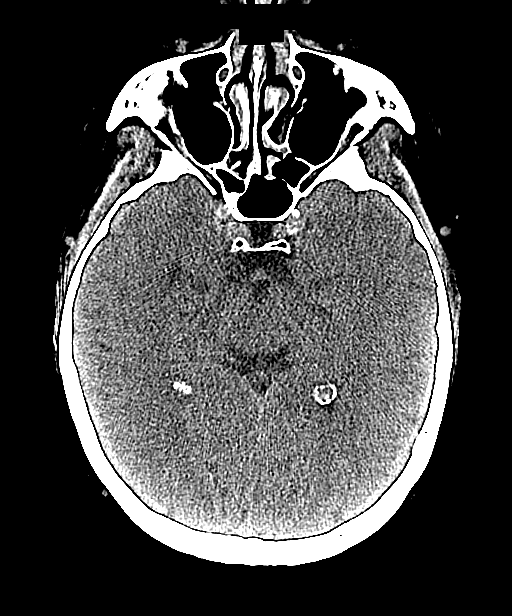
[im 51/89  brain]
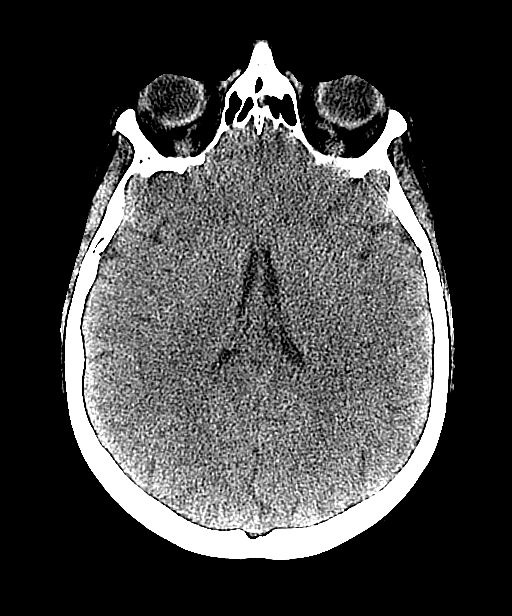
[im 51/89  bone]
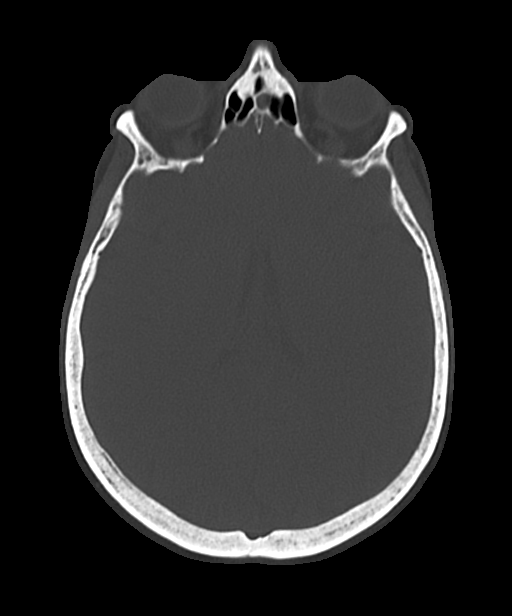
[im 59/89  brain]
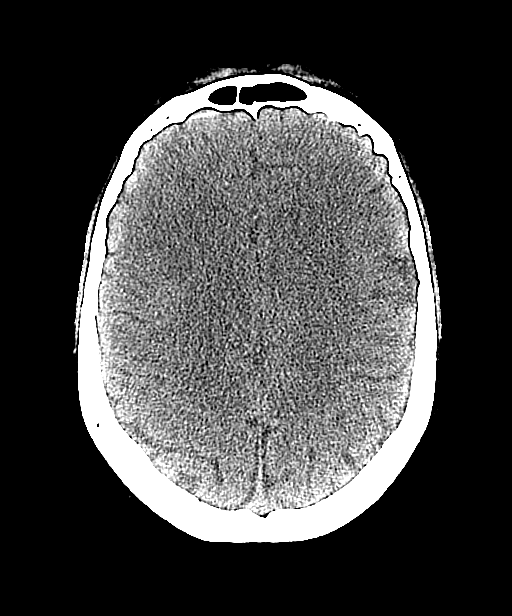
[im 72/89  brain]
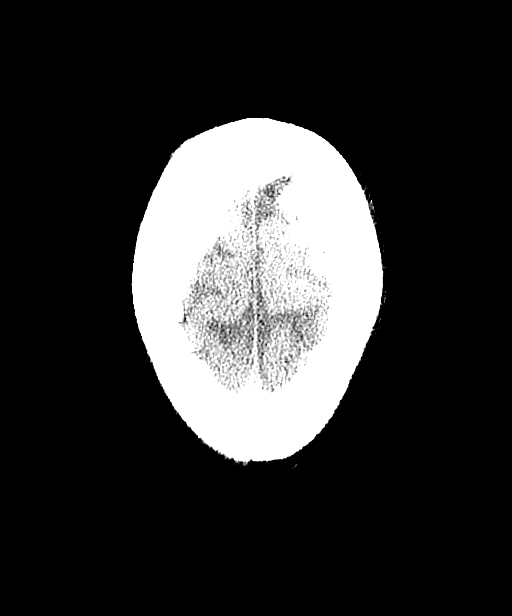
[im 80/89  brain]
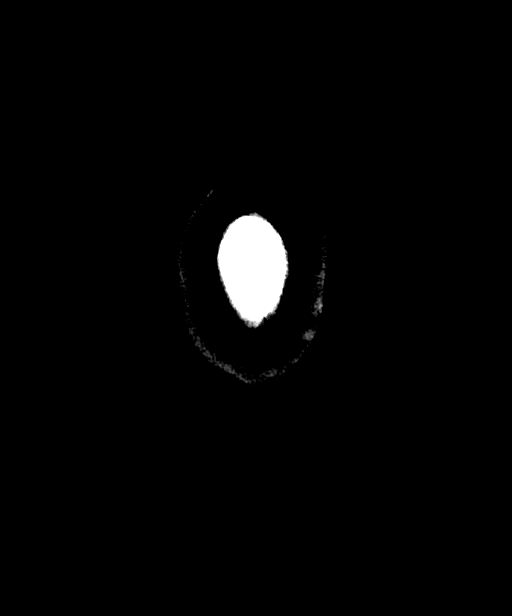

[14 of 47 positions shown; findings below may reference images not displayed]

All CT scans at this facility use dose modulation, interval reconstruction, and/or weight-based
dosing when appropriate to reduce radiation dose to as low as reasonably achievable.

Number of previous computed tomography exams in the last 12 months is 3. Number of previous nuclear
medicine myocardial perfusion studies in the last 12 months is 0.
FINDINGS: BRAIN:  NO evidence of supratentorial hemorrhage or mass.

VENTRICLES:  The cerebral ventricles are normal in size.

BONES/JOINTS:  Calvarium and skull base demonstrate NO acute changes.

SOFT TISSUES:  Soft tissues demonstrate NO fluid collections or foreign body.

VASCULATURE:  Mild vascular calcification.

SINUSES:  NO opacifications or air-fluid levels.

MASTOID AIR CELLS:  NO opacifications or air-fluid levels.
IMPRESSION: - NO evidence of acute intracranial hemorrhage or mass.

- Incidental/non-acute findings are described above.

NOTE:  If the patient's symptoms persist or worsen, negative or nonspecific CT findings should NOT
delay further evaluation.

Tech Notes:

Hypertension with history of TIA. CT/NM 3/0. TB

## 2022-03-10 IMAGING — CR [ID]
1 series · 1 of 1 positions shown · non-contrast
Comparison: 08/30/2021.

03/10/22

DIAGNOSTIC STUDIES
EXAM:  XR CHEST, 1 VIEW  ([id number])
INDICATION: SOA high blood pressure. falls, pain in low back. pt states "mini strokes"
TECHNIQUE: Single view of the chest.

[x chest ap]
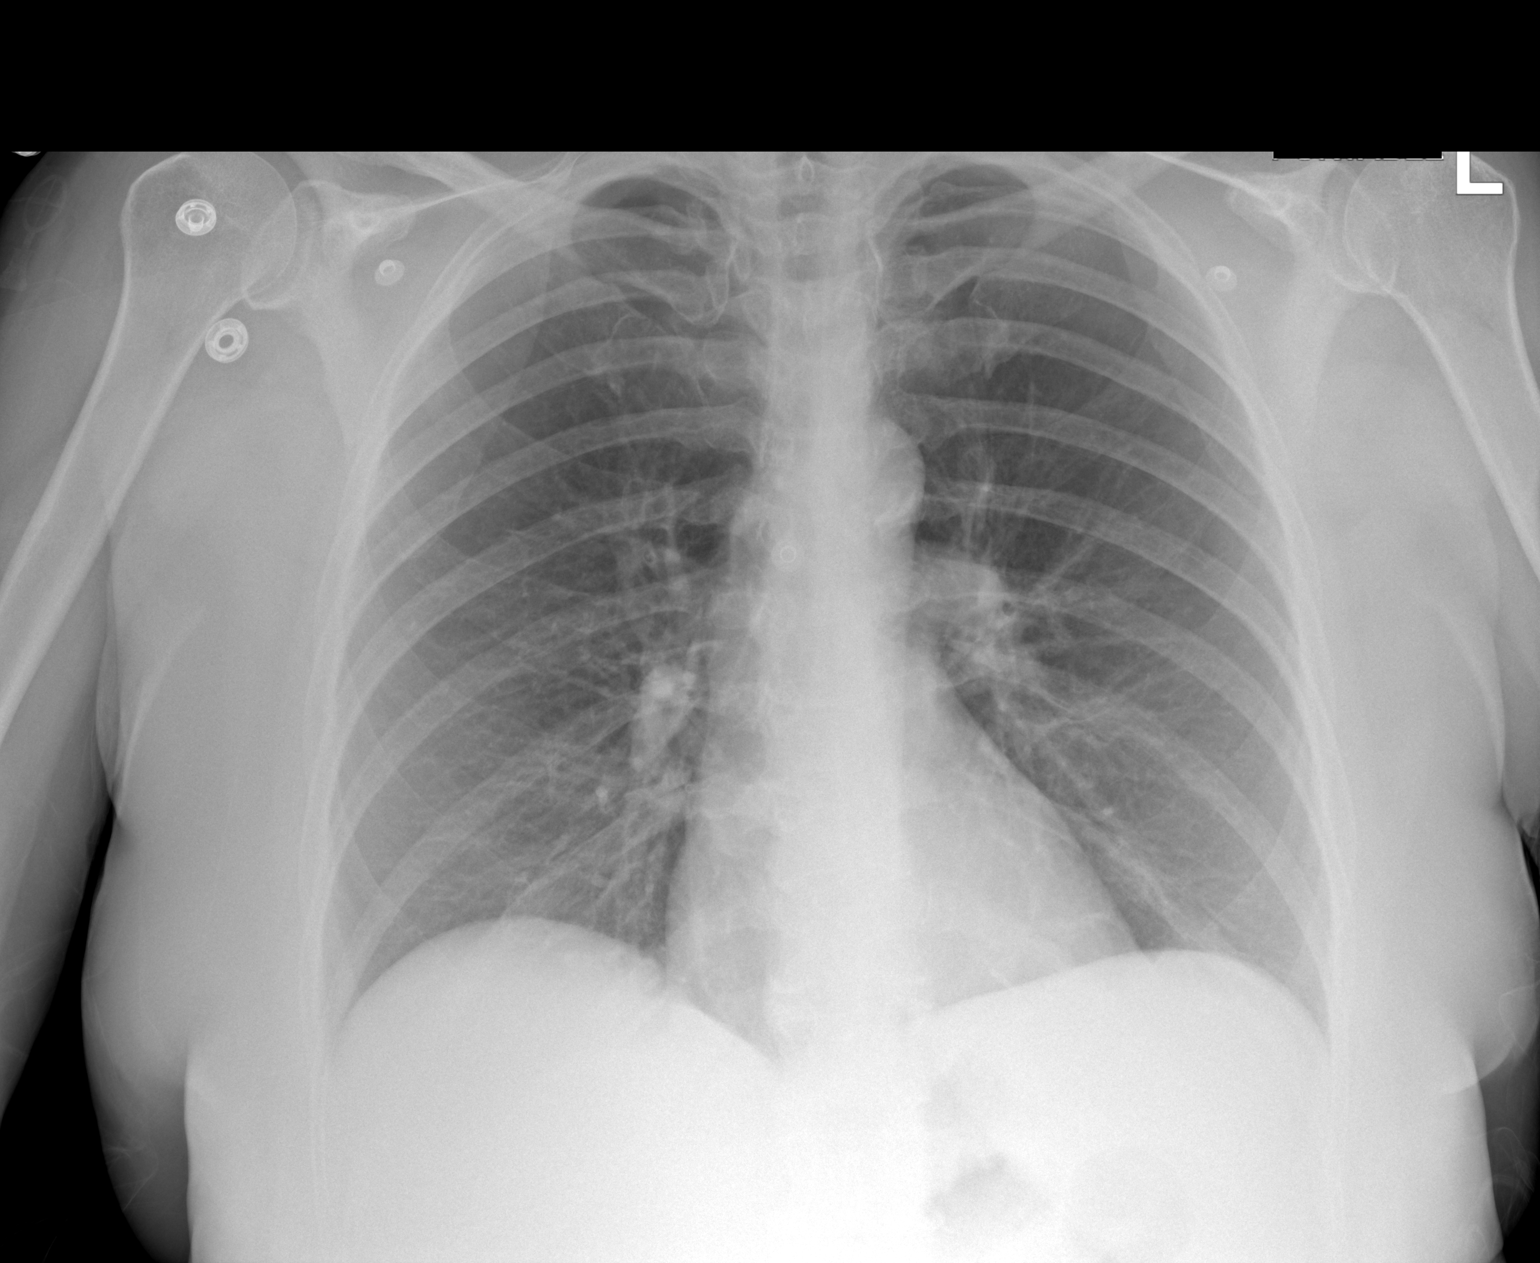

[1 of 1 positions shown; findings below may reference images not displayed]

FINDINGS: LUNGS:  The lungs demonstrate NO consolidations or significant pleural effusion.

HEART:  The heart borders are mostly obscured, apex NOT visualized.

BONES/JOINTS:  No definite acute changes.
IMPRESSION: - NO consolidative pneumonia or significant atelectasis.

- Incidental/non-acute findings are described above.

Tech Notes:

high blood pressure. falls, pain in low back. pt states "mini strokes"

## 2022-03-12 ENCOUNTER — Encounter: Admit: 2022-03-12 | Discharge: 2022-03-12 | Payer: BC Managed Care – PPO

## 2022-04-12 ENCOUNTER — Encounter: Admit: 2022-04-12 | Discharge: 2022-04-12 | Payer: MEDICARE

## 2022-04-12 NOTE — Telephone Encounter
04-12-2022 Per Task Message, requests faxed to [facility], [phone], and Dr Verneda Skill, (F) [phone], clp      Hilbert Odor Phone: [phone]      Collect Outside Records Not Started 04/19/2022 Verneda Skill, MD Phone: [phone] Fax: [phone]

## 2022-04-30 ENCOUNTER — Encounter: Admit: 2022-04-30 | Discharge: 2022-04-30 | Payer: MEDICARE

## 2022-04-30 ENCOUNTER — Ambulatory Visit: Admit: 2022-04-30 | Discharge: 2022-05-01 | Payer: No Typology Code available for payment source

## 2022-04-30 DIAGNOSIS — M545 Chronic low back pain: Secondary | ICD-10-CM

## 2022-04-30 DIAGNOSIS — G459 Transient cerebral ischemic attack, unspecified: Secondary | ICD-10-CM

## 2022-04-30 DIAGNOSIS — R55 Syncope and collapse: Secondary | ICD-10-CM

## 2022-04-30 DIAGNOSIS — G5710 Meralgia paresthetica, unspecified lower limb: Secondary | ICD-10-CM

## 2022-05-01 DIAGNOSIS — G629 Polyneuropathy, unspecified: Secondary | ICD-10-CM

## 2022-05-01 DIAGNOSIS — M5440 Lumbago with sciatica, unspecified side: Secondary | ICD-10-CM

## 2022-05-01 DIAGNOSIS — G8929 Other chronic pain: Secondary | ICD-10-CM

## 2022-05-05 ENCOUNTER — Encounter: Admit: 2022-05-05 | Discharge: 2022-05-05 | Payer: MEDICARE

## 2022-05-05 NOTE — Telephone Encounter
-----   Message from Jorge F Kawano-Castillo, MD sent at 04/30/2022  6:26 PM CDT -----  Regarding: Outside records    Patient was here today. Can we please try to obtain records and cloud imaging from outside hospital ([facility])?    Thanks    Jorge

## 2022-05-05 NOTE — Telephone Encounter
Recent new patient on 04/30/22     RN faxed [facility] and emailed [facility].

## 2022-05-05 NOTE — Telephone Encounter
RN emailed [facility] to request for any images that patient may have previously undergone to be clouded to patient's [facility] EHR.    Stacey Frost  [birth year]   [id number]    Recent new patient of Dr. Heloise Ochoa on 04/30/22    Please request all imaging of patient's head/neck/spine/carotid artery from the following facilities:    [facility] in [place], [place]    RN also faxing for reports.

## 2022-05-05 NOTE — Telephone Encounter
-----   Message from Katherine Basset, MD sent at 04/30/2022  6:26 PM CDT -----  Regarding: Outside records    Patient was here today. Can we please try to obtain records and cloud imaging from outside hospital [facility])?    Thanks    Stacey Frost

## 2022-05-10 ENCOUNTER — Encounter: Admit: 2022-05-10 | Discharge: 2022-05-10 | Payer: MEDICARE

## 2022-05-11 ENCOUNTER — Encounter: Admit: 2022-05-11 | Discharge: 2022-05-11 | Payer: MEDICARE

## 2022-05-12 ENCOUNTER — Encounter: Admit: 2022-05-12 | Discharge: 2022-05-12 | Payer: MEDICARE

## 2022-05-12 DIAGNOSIS — I10 Essential (primary) hypertension: Secondary | ICD-10-CM

## 2022-05-12 DIAGNOSIS — G5710 Meralgia paresthetica, unspecified lower limb: Secondary | ICD-10-CM

## 2022-05-12 DIAGNOSIS — R4702 Dysphasia: Secondary | ICD-10-CM

## 2022-05-12 DIAGNOSIS — M545 Chronic low back pain: Secondary | ICD-10-CM

## 2022-05-12 DIAGNOSIS — R55 Syncope and collapse: Secondary | ICD-10-CM

## 2022-05-12 DIAGNOSIS — F1210 Cannabis abuse, uncomplicated: Secondary | ICD-10-CM

## 2022-05-12 DIAGNOSIS — G459 Transient cerebral ischemic attack, unspecified: Secondary | ICD-10-CM

## 2022-05-12 NOTE — Progress Notes
Records Request- STAT    Medical records request for continuation of care:    Patient has appointment  with  Dr. Avie Arenas    Please fax records to [facility] [phone]    Request records:    Stacey Frost  06/10/1966    Office Notes- PCP most recent- pt states she was there within the last week        Thank you,      Cardiovascular Medicine  [facility]  [address]  [place], [place] [address]  Phone:  [phone]  Fax:  [phone]

## 2022-05-18 ENCOUNTER — Encounter: Admit: 2022-05-18 | Discharge: 2022-05-18 | Payer: MEDICARE

## 2022-05-18 ENCOUNTER — Ambulatory Visit: Admit: 2022-05-18 | Discharge: 2022-05-18 | Payer: MEDICARE

## 2022-05-18 DIAGNOSIS — G5711 Meralgia paresthetica, right lower limb: Secondary | ICD-10-CM

## 2022-05-18 DIAGNOSIS — R4702 Dysphasia: Secondary | ICD-10-CM

## 2022-05-18 DIAGNOSIS — I10 Essential (primary) hypertension: Secondary | ICD-10-CM

## 2022-05-18 DIAGNOSIS — Z79899 Other long term (current) drug therapy: Secondary | ICD-10-CM

## 2022-05-18 DIAGNOSIS — R55 Syncope and collapse: Secondary | ICD-10-CM

## 2022-05-18 DIAGNOSIS — M48062 Spinal stenosis, lumbar region with neurogenic claudication: Secondary | ICD-10-CM

## 2022-05-18 DIAGNOSIS — R079 Chest pain, unspecified: Secondary | ICD-10-CM

## 2022-05-18 DIAGNOSIS — G459 Transient cerebral ischemic attack, unspecified: Secondary | ICD-10-CM

## 2022-05-18 DIAGNOSIS — F1210 Cannabis abuse, uncomplicated: Secondary | ICD-10-CM

## 2022-05-18 DIAGNOSIS — G8929 Other chronic pain: Secondary | ICD-10-CM

## 2022-05-18 DIAGNOSIS — G5710 Meralgia paresthetica, unspecified lower limb: Secondary | ICD-10-CM

## 2022-05-18 DIAGNOSIS — F17210 Nicotine dependence, cigarettes, uncomplicated: Secondary | ICD-10-CM

## 2022-05-18 DIAGNOSIS — Z23 Encounter for immunization: Secondary | ICD-10-CM

## 2022-05-18 DIAGNOSIS — Z8249 Family history of ischemic heart disease and other diseases of the circulatory system: Secondary | ICD-10-CM

## 2022-05-18 DIAGNOSIS — M545 Chronic low back pain: Secondary | ICD-10-CM

## 2022-05-18 DIAGNOSIS — R002 Palpitations: Secondary | ICD-10-CM

## 2022-05-18 DIAGNOSIS — R768 Other specified abnormal immunological findings in serum: Secondary | ICD-10-CM

## 2022-05-18 DIAGNOSIS — F418 Other specified anxiety disorders: Secondary | ICD-10-CM

## 2022-05-18 DIAGNOSIS — R0989 Other specified symptoms and signs involving the circulatory and respiratory systems: Secondary | ICD-10-CM

## 2022-05-18 NOTE — Progress Notes
To our valued patient,     We have enrolled your heart monitor and requested it to be mailed to your home.  You should receive this within 2-3 business days. Please wear the monitor for 14 days. When you have completed the study, please remove the device, and mail it back to the company. Please call [employer] Customer Service at [phone] with questions about placement, troubleshooting, and insurance coverage. You can reach the ambulatory heart monitor team at [phone].        ? Please write your NAME, PHYSICIAN, START DATE/TIME on the diary.    ? Prep skin by shaving and ensuring there is no lotion on the chest.  Gently abrade for clear ecgs.  ? Showering is okay, but best to shower with back to the water.    ? Please use the diary for symptoms - specific date and times and what you are feeling.  ? Please return the device in the mailer with diary promptly after completing the study.        Your Heart Rhythm Management Team  Cardiovascular Medicine Department at [facility]          Ambulatory (External) Cardiac Monitor Enrollment Record     Placement Location: Home Enrollment  Vendor: [employer] ([employer])  Mobile Cardiac Telemetry (MCOT/MCT)?: No  Duration of Monitor (in days): 14  Monitor Diagnosis: Syncope (R55)  Secondary Monitor Diagnosis: Chest Pain (N56.2)  Ordering Provider: Dorris Fetch, MD  AMB Monitor Serial Number: home  AMB Monitor Expiration Date:  (home)      Start Time and Date: 05/18/22 1:25 PM   Patient Name: Stacey Frost  DOB: [birth year]  MRN: [id number]  Sex: female  Mobile Phone Number: [phone] (mobile)  Home Phone Number: [phone]  Patient Address: [address] [place] [address]  Insurance Coverage: [facility] COMMUN[employer] PLAN SNP - [place]  Insurance ID: [id number]  Insurance Group #: [id number] Leigh  Insurance Subscriber: Stacey Frost,Stacey Frost  Implanted Cardiac Device Information: No results found for: EPDEVTYP      Patient instructed to contact company phone number on the monitor box with questions regarding billing, placement, troubleshooting.     Barbar Brede    ____________________________________________________________    Clinic Staff:    ? Complete additional steps for documentation double check/Co-Sign.  ? In Follow-up, send chart upon closing encounter to P [facility] HRM AMBULATORY MONITORS    HRM Ambulatory Monitoring Team:  1. Schedule on appropriate template and check-in.   Clinic Placement Schedule on clinic location [facility] schedule   Home Enrollment Schedule on Home Enrollment schedule ([facility])   Given to patient in clinic for self-placement Schedule on Home Enrollment schedule ([facility])   Inpatient Schedule on [facility] [place] [facility] AMBULATORY MONITORING template   2. Please enroll with appropriate vendor.

## 2022-05-18 NOTE — Patient Instructions
Thank you for visiting our office today.    We would like to make the following medication adjustments:      Stop taking hydrochlorothiazide.  Split lisinopril into two doses so you will be taking 10 mg twice daily.       Otherwise continue the same medications as you have been doing.          We will be pursuing the following tests after your appointment today:       Orders Placed This Encounter    2D + DOPPLER ECHO    PV CAROTID ARTERY DUPLEX SCAN    [employer] ePatch (Holter) 8-15 days    REGADENOSON MPI STRESS TEST          Please call us in the meantime with any questions or concerns.        Please allow 5-7 business days for our providers to review your results. All normal results will go to MyChart. If you do not have Mychart, it is strongly recommended to get this so you can easily view all your results. If you do not have mychart, we will attempt to call you once with normal lab and testing results. If we cannot reach you by phone with normal results, we will send you a letter.  If you have not heard the results of your testing after one week please give [place] a call.       Your Cardiovascular Medicine Organ Team Richardson Landry, Rene Kocher and [place])  phone number is [phone].

## 2022-05-26 ENCOUNTER — Encounter: Admit: 2022-05-26 | Discharge: 2022-05-26 | Payer: No Typology Code available for payment source

## 2022-05-26 ENCOUNTER — Ambulatory Visit: Admit: 2022-05-26 | Discharge: 2022-05-26 | Payer: No Typology Code available for payment source

## 2022-05-26 DIAGNOSIS — R55 Syncope and collapse: Secondary | ICD-10-CM

## 2022-05-26 DIAGNOSIS — F1210 Cannabis abuse, uncomplicated: Secondary | ICD-10-CM

## 2022-05-26 DIAGNOSIS — G459 Transient cerebral ischemic attack, unspecified: Secondary | ICD-10-CM

## 2022-05-26 DIAGNOSIS — Z23 Encounter for immunization: Secondary | ICD-10-CM

## 2022-05-26 DIAGNOSIS — I10 Essential (primary) hypertension: Secondary | ICD-10-CM

## 2022-05-26 DIAGNOSIS — G629 Polyneuropathy, unspecified: Secondary | ICD-10-CM

## 2022-05-26 DIAGNOSIS — G5710 Meralgia paresthetica, unspecified lower limb: Secondary | ICD-10-CM

## 2022-05-26 DIAGNOSIS — Z79899 Other long term (current) drug therapy: Secondary | ICD-10-CM

## 2022-05-26 DIAGNOSIS — M48062 Spinal stenosis, lumbar region with neurogenic claudication: Secondary | ICD-10-CM

## 2022-05-26 DIAGNOSIS — R4702 Dysphasia: Secondary | ICD-10-CM

## 2022-05-26 DIAGNOSIS — R202 Paresthesia of skin: Secondary | ICD-10-CM

## 2022-05-26 DIAGNOSIS — M545 Chronic low back pain: Secondary | ICD-10-CM

## 2022-05-26 LAB — LIPID PROFILE
CHOLESTEROL: 175 mg/dL (ref <200)
HDL: 53 mg/dL (ref >40)
LDL: 115 mg/dL — ABNORMAL HIGH (ref <100)
NON HDL CHOLESTEROL: 122 mg/dL
VLDL: 16 mg/dL

## 2022-05-26 LAB — CREATINE KINASE-CPK: CK TOTAL: 88 U/L (ref 21–215)

## 2022-05-26 LAB — HEMOGLOBIN A1C: HEMOGLOBIN A1C: 5.4 % (ref <150)

## 2022-05-26 LAB — VITAMIN B12: VITAMIN B12: 177 pg/mL — ABNORMAL LOW (ref 180–914)

## 2022-05-26 NOTE — Progress Notes
Date of Service: 05/26/2022    Subjective:             Stacey Frost is a 56 y.o. female here for follow-up evaluation of paresthesias.     History of Present Illness  Stacey Frost 56 y.o. female with a past medical history of HTN, degenerative disc disease, tobacco/alcohol use and possible stroke (episode of whole body numbness and high BP values, symptoms resolved within a few days), who is here for follow-up due to neuropathy. Patient has had chronic low back pain (not radiated to either leg, similar lately) and also reports significant bilateral thigh pain (with burning sensation, radiated to both lower legs) as well as subjective LE weakness and hand/leg/feet paresthesias. She also describes persistence balance problems (but has been walking without assistance) and denies recent falls or new stroke like symptoms. However, she also reports some passing out activity in the past few months. Denies warnings, confusion afterwards, tongue biting or incontinence with them. Some episodes were witnessed and associated with intense cough, eyes are usually closed, she has had some shivering/jerking with spells, but she denies clear tonic/clonic activity. Patient also reports occasional headaches (denies a lot lately), but no recent falls reported. Brain imaging showed question of small stroke.(right frontal lobe), carotid doppler with bilateral ICA stenosis. EMG was normal in the past, new EMG was ordered/pending. Cardiologist also ordered carotid doppler and TTE due to recent syncopal events. Labs included unremarkable TSH and vitamin B12 level was 320. She takes aspirin. Patient reports persistent sleep problems (but better lately).      Medications tried:   Gabapentin 600/300/600mg  (reports some benefit, denies side effects)  Duloxetine 60mg  bid (reports some benefit, denies significant side effects)  Flexeril (denies clear benefit)  Tried baclofen but caused memory loss.         Review of Systems   Constitutional: Positive for appetite change and diaphoresis.   HENT: Negative.    Eyes: Negative.    Respiratory: Positive for choking and shortness of breath.    Cardiovascular: Negative.    Gastrointestinal: Negative.    Endocrine: Negative.    Genitourinary: Negative.    Musculoskeletal: Positive for back pain, gait problem, myalgias, neck pain and neck stiffness.   Skin: Negative.    Allergic/Immunologic: Negative.    Neurological: Positive for numbness.   Hematological: Negative.    Psychiatric/Behavioral: The patient is nervous/anxious.        ALLERGIES  Allergies   Allergen Reactions   ? Pcn [Penicillins] HIVES     Objective:         ? albuterol sulfate (PROAIR HFA) 90 mcg/actuation HFA aerosol inhaler INHALE 2 PUFFS BY MOUTH EVERY 4 HOURS AS NEEDED FOR 30 DAYS   ? ALPRAZolam (XANAX) 1 mg tablet Take one tablet by mouth at bedtime daily.   ? amLODIPine (NORVASC) 10 mg tablet Take one tablet by mouth daily.   ? aspirin EC (ASPIR-LOW) 81 mg tablet Take one tablet by mouth daily.   ? baclofen (LIORESAL) 10 mg tablet Take one-half tablet by mouth three times daily.   ? cyclobenzaprine (FLEXERIL) 10 mg tablet Take one tablet by mouth at bedtime daily.   ? dicyclomine (BENTYL) 10 mg capsule Take 1 Cap by mouth four times daily.   ? duloxetine DR (CYMBALTA) 60 mg capsule Take one capsule by mouth daily.   ? Empty Container (ENEMA) botl Use  as directed.   ? gabapentin (NEURONTIN) 300 mg capsule Take one capsule by mouth three times daily.  600 am 300 noon 600 pm   ? hydroCHLOROthiazide (HYDRODIURIL) 25 mg tablet    ? HYDROCODONE BIT/HOMATROP ME-BR (HYDROCODONE COMPOUND PO) Take  by mouth.   ? lisinopriL (ZESTRIL) 20 mg tablet Take one tablet by mouth daily. for 30 days   ? LORazepam (ATIVAN) 1 mg tablet Take one tablet by mouth every 4 hours as needed.   ? meloxicam (MOBIC) 15 mg tablet Take one tablet by mouth daily.   ? omeprazole DR (PRILOSEC) 40 mg capsule Take one capsule by mouth daily.   ? ondansetron (ZOFRAN) 4 mg tablet Take 1 Tab by mouth every 8 hours as needed for Nausea.   ? traMADoL (ULTRAM) 50 mg tablet Take one tablet by mouth twice daily as needed.     Vitals:    05/26/22 0816   PainSc: Seven  Comment: burnning   Weight: 75.7 kg (166 lb 12.8 oz)   Height: 157.5 cm (5' 2)     Body mass index is 30.51 kg/m?Marland Kitchen     Physical Exam  GENERAL APPEARANCE: The patient is alert. Patient is in no acute distress, following commands and cooperative.     NEUROLOGIC EXAM:   Orientation: The patient is alert and oriented times three. Patient is following commands. Speech is fluent, intact comprehension, repetition and naming.    Cranial nerves:  1st cranial nerve:  not tested   2nd cranial nerve: normal; Visual fields are full to confrontation. Pupils are equal, round, and reactive to light and accommodation.   3rd, 4th & 6th cranial nerves: Extraocular movements are intact without nystagmus.  5th cranial nerve: normal; intact muscles of mastication. Intact light touch and pin prick.  7th cranial nerve: normal; no facial asymmetry  8th cranial nerve: normal  9th & 10th cranial nerves: normal.  11th cranial nerve: normal; Shoulder shrug symmetric   12th cranial nerve: normal; tongue is midline.     Strength: (Right/Left) Deltoid 5/5, Biceps 5/5, Triceps 5/5, Finger ext 5/5, interossei 5/5, Hip Flexion 5/5, Knee ext 5/5, Knee flex 5/5, Ankle dorsiflexion 5/5, Ankle plantarflexion 5/5.   Normal bulk and tone in all four limbs without any evidence of an arm drift.  Normal tone. There are no abnormal movements during examination.   Sensory: Intact to pain, mildly decreased to temperature distally in both LE and fair vibration distally in both lower extremities.  Coordination is intact finger-to-nose.    Deep tendon reflexes are 2+ in biceps, triceps, brachioradialis and patellar bilaterally and 1+ ankle reflex.  Hoffman sign is absent bilaterally. Clonus not elicited.   Gait is abnormal without clear ataxia, but cautious/antalgic features. Assessment and Plan:  Possible peripheral neuropathy, history of TIA/possible small stroke and syncopal episodes. Patient reports chronic history of paresthesias, affecting both hands/feet and she also describes chronic low back pain (denies radicular pattern), bilateral LE pain and subjective weakness; as well as balance issues but denies recent falls. Patient also has passing out spells with no clear features of seizures but family has seen some tremors during some events. Neurological exam is similar today with distal sensory loss to temperature. We will repeat MRI brain and order EEG due to recurrent passing out events, we will also obtain labs and EMG due to persistent LE pain and paresthesias.     Recommendations:  1. We will repeat brain MRI and order EEG due to recurrent passing out activity reported.   2. Patient advised to keep EMG and schedule carotid doppler appointment.    3. Labs including  B12 level, HbA1c, CK and lipid panel.   4. Fall precautions were discussed during clinic visit. Patient also advised to consider PT due to balance issues and low back pain and to discuss with PCP about LE symptoms, may also benefit from vascular surgery referral to evaluate for vascular etiology.   5. We will refer the patient to our neuromuscular clinic.     Total time was 40 minutes. This time was spent preparing to see the patient, obtaining and/or reviewing history, performing an examination, ordering medications, tests/ procedures, communicating results to the patient, documenting clinical information in the electronic health record and counseling/educating the patient regarding neuropathy.

## 2022-06-08 ENCOUNTER — Encounter: Admit: 2022-06-08 | Discharge: 2022-06-08 | Payer: No Typology Code available for payment source

## 2022-06-16 ENCOUNTER — Encounter: Admit: 2022-06-16 | Discharge: 2022-06-16 | Payer: No Typology Code available for payment source

## 2022-06-17 ENCOUNTER — Encounter: Admit: 2022-06-17 | Discharge: 2022-06-17 | Payer: No Typology Code available for payment source

## 2022-06-17 NOTE — Telephone Encounter
-----   Message from Vertell Novak, MD sent at 06/16/2022  7:37 PM CDT -----  Please call the patient and let her know that the Holter monitor did not demonstrate any significant heart rhythm abnormalities, it was overall unremarkable.    We will call you the results of the rest of the test when they will be completed.    Thank you      ----- Message -----  From: Vertell Novak, MD  Sent: 06/14/2022   9:10 PM CDT  To: Vertell Novak, MD

## 2022-06-17 NOTE — Telephone Encounter
Discussed holter monitor results with patient. Patient states she has some other medical problems she is focused on at this time and does not have the other cardiac testing set up and declines to have completed at this time. She will call if/when she would like to schedule them.

## 2022-06-25 ENCOUNTER — Encounter: Admit: 2022-06-25 | Discharge: 2022-06-25 | Payer: No Typology Code available for payment source

## 2022-06-27 ENCOUNTER — Encounter: Admit: 2022-06-27 | Discharge: 2022-06-27 | Payer: No Typology Code available for payment source

## 2022-07-26 ENCOUNTER — Encounter: Admit: 2022-07-26 | Discharge: 2022-07-26 | Payer: No Typology Code available for payment source

## 2022-07-26 ENCOUNTER — Ambulatory Visit: Admit: 2022-07-26 | Discharge: 2022-07-26 | Payer: No Typology Code available for payment source

## 2022-07-26 DIAGNOSIS — R768 Other specified abnormal immunological findings in serum: Secondary | ICD-10-CM

## 2022-07-26 DIAGNOSIS — B182 Chronic viral hepatitis C: Secondary | ICD-10-CM

## 2022-07-26 DIAGNOSIS — R4702 Dysphasia: Secondary | ICD-10-CM

## 2022-07-26 DIAGNOSIS — Z79899 Other long term (current) drug therapy: Secondary | ICD-10-CM

## 2022-07-26 DIAGNOSIS — I10 Essential (primary) hypertension: Secondary | ICD-10-CM

## 2022-07-26 DIAGNOSIS — G5710 Meralgia paresthetica, unspecified lower limb: Secondary | ICD-10-CM

## 2022-07-26 DIAGNOSIS — M545 Chronic low back pain: Secondary | ICD-10-CM

## 2022-07-26 DIAGNOSIS — G459 Transient cerebral ischemic attack, unspecified: Secondary | ICD-10-CM

## 2022-07-26 DIAGNOSIS — F1210 Cannabis abuse, uncomplicated: Secondary | ICD-10-CM

## 2022-07-26 DIAGNOSIS — M48062 Spinal stenosis, lumbar region with neurogenic claudication: Secondary | ICD-10-CM

## 2022-07-26 DIAGNOSIS — R55 Syncope and collapse: Secondary | ICD-10-CM

## 2022-07-26 DIAGNOSIS — Z23 Encounter for immunization: Secondary | ICD-10-CM

## 2022-07-26 LAB — HEPATITIS B SURFACE AG

## 2022-07-26 LAB — CHLAM/NG PCR URINE
CHLAMYDIA TRACH PCR: NEGATIVE
NEISSERIA GONORROEAE PCR: NEGATIVE

## 2022-07-26 LAB — HEPATITIS B SURFACE AB: HEP B SURFACE ABY: NEGATIVE

## 2022-07-26 LAB — SYPHILIS AB SCREEN: SYPHILIS AB, TOTAL: NEGATIVE

## 2022-07-26 LAB — HEPATITIS C ANTIBODY W REFLEX HCV PCR QUANT

## 2022-07-26 LAB — HEPATITIS B CORE AB TOT (IGG+IGM)

## 2022-07-26 LAB — HIV 1 & 2 AG-AB SCRN W REFLEX TO HIV CONFIRMATION

## 2022-07-26 NOTE — Progress Notes
Date of Service: 07/26/2022  INFECTIOUS DISEASES CONSULT    Referred by: Dr Darl Pikes  Re: Hepatitis C  Subjective:             Stacey Frost is a 56 y.o. female.  56 year old female patient with history of chronic pain, at present being worked up by neurology for possible peripheral neuropathy, also history of hypertension, chronic anxiety, TIA, and syncopal episodes, who was found to have a positive hepatitis C antibody, with an undetectable hepatitis C viral load.  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  Patient reports history of trichomonas infection, but no other sexual transmitted infections in the past.    History of Present Illness   HEPATITIS  C:  Date of Diagnosis: 04/12/2022.  This is the first time that the patient remembers having this test done.  Geno type: Not available  HCV Viral load: Undetectable  AFP: Not available  Autoimmune panel - reviewed.  Rheumatoid factor less than 14.  CRP: 0.8.  TSH  FibroTest-Actitest Panel : Not available  Liver elastography not obtained yet  Iron panel not available  PT/INR: Not available  Liver biopsy done / indicated?:  Not indicated  US/CT findings: Will order ultrasound with elastography when hepatitis C infection is confirmed  Risk factor: Distant history of IVDU  Previously treated: Patient denies any previous treatment  Active  RF?:  Not active  Advanced liver disease?:  No  Extrahepatic disease?:  No  Autoimmune diseases?:  No  Depression/anxiety: Yes  CKD: No  Alcohol history: Yes, distant history.  No active alcohol abuse.  Drug history: Yes, distant history of IV amphetamine use between ages 65 and 74.  No active use.  Other liver diagnosis: PSG, PBC, NASH: No  Hepatocellular cancer: No  Co-infections (Hep B, Hep A, HIV): Will obtain serologies     PMH:   1. HTN  2. Peripheral neuropathy   3. Chronic anxiety 4. History of syncope   5. GERD   6. TIA on January 2023   7. History of COVID-19 infection in September 2022, mild presentation.    8. History of trichomonal infection, no other STDs in the past.     Family Hx:   Significant for CVA, COPD, and liver cirrhosis from alcoholic liver disease in her mother.  Allergies:   Penicillin, baclofen.  Meds;   Reviewed   Social Hx:   Born in [place].  Patient has lived in several states, since her father was in the [employer]. She also lived in [place]. 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  Sexual orientation: heterosexual. H/o commercial sex ? No.   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    1 dog at home, no known sick contact.   No relevant travel Hx.      Review of Systems   Patient reports her chronic aces and pains, mostly bilateral leg pain.   A comprehensive 14 point review of systems was negative aside from those issues noted above    No N/V/D   No fever or constitutional symptoms.   ROS is otherwise negative  Objective:         ? albuterol sulfate (PROAIR HFA) 90 mcg/actuation HFA aerosol inhaler INHALE 2 PUFFS BY MOUTH EVERY 4 HOURS AS NEEDED FOR 30 DAYS   ? ALPRAZolam (XANAX) 1 mg tablet Take one tablet by mouth at bedtime daily.   ? amLODIPine (NORVASC) 10 mg tablet Take one tablet by mouth daily.   ? aspirin EC (ASPIR-LOW) 81 mg tablet Take one tablet by mouth daily.   ? baclofen (LIORESAL) 10 mg tablet Take one-half tablet by mouth three times daily.   ? cyclobenzaprine (FLEXERIL) 10 mg tablet Take one tablet by mouth at bedtime daily.   ? dicyclomine (BENTYL) 10 mg capsule Take 1 Cap by mouth four times daily.   ? duloxetine DR (CYMBALTA) 60 mg capsule Take one capsule by mouth daily.   ? Empty Container (ENEMA) botl Use  as directed.   ? gabapentin (NEURONTIN) 300 mg capsule Take one capsule by mouth three times daily. 600 am 300 noon 600 pm   ? hydroCHLOROthiazide (HYDRODIURIL) 25 mg tablet    ? HYDROCODONE BIT/HOMATROP ME-BR (HYDROCODONE COMPOUND PO) Take  by mouth.   ? lisinopriL (ZESTRIL) 20 mg tablet Take one tablet by mouth daily. for 30 days   ? LORazepam (ATIVAN) 1 mg tablet Take one tablet by mouth every 4 hours as needed.   ? meloxicam (MOBIC) 15 mg tablet Take one tablet by mouth daily.   ? omeprazole DR (PRILOSEC) 40 mg capsule Take one capsule by mouth daily.   ? ondansetron (ZOFRAN) 4 mg tablet Take 1 Tab by mouth every 8 hours as needed for Nausea.   ? traMADoL (ULTRAM) 50 mg tablet Take one tablet by mouth twice daily as needed.     Vitals:    07/26/22 1321   BP: 127/62   BP Source: Arm, Right Upper   Pulse: 84   Temp: 36.3 ?C (97.4 ?F)   SpO2: 97%   PainSc: Eight   Weight: 76.4 kg (168 lb 8 oz)   Height: 157.5 cm (5' 2)     Body mass index is 30.82 kg/m?Marland Kitchen     Physical Exam  General appearance: alert, oriented, NAD  HENT: mucus membranes moist, no oral lesions/thrush  Eyes: PERRL, EOM grossly intact, Conj nl  Neck: supple, no lymphadenopathy, thyroid not palpable  Lungs: no wheezing, rhonchi, rales appreciated  Heart: Regular rhythm, reg rate, with no murmur, rub, gallop  Abdomen: soft, non-tender, non-distended, normoactive bowel sounds, no hepatosplenomegaly, no masses  Ext: No clubbing, cyanosis or edema  Skin: no rashes/lesions  Lymph: no cervical, axillary or inguinal adenopathy       Assessment and Plan:  56 year old female with history of substance abuse in the distant past, hypertension, TIA, syncopal episodes, and chronic leg pain at present being worked up for possible peripheral neuropathy, presents for evaluation for a positive hepatitis C antibody, with a negative hepatitis C RNA viral load.  She is also found to have a positive RPR, with a titer of 1: 1, but she has no knowledge of previous diagnosis of syphilis or treatment.  She has been referred for evaluation for both hepatitis C, and syphilis.  A positive hepatitis C antibody, with a negative hepatitis C viral load can happen in 3 different instances, The first would be a false positive HCV Ab test.  Second, this could be a patient who had acute hepatitis C in the past, and spontaneously cleared the virus and never develop chronic infection.  Third, this  could also be a case of previously treated chronic hepatitis C, although patient gives no history of prior treatment.  As far as a diagnosis of syphilis, an RPR titer of 1:1 could be typically seen after previous diagnosis and treatment of syphilis, but the patient is not aware of either a previous diagnosis or previous treatment of syphilis.  I will order today hepatitis B surface antigen, hepatitis B surface antibody, hepatitis B core antibody total, hepatitis C antibody, and hepatitis C RNA viral load.  I will also order syphilis antibody with reflex RPR, and GC/Chlamydia PCR in urine.  I will also order an HIV 1-2, antigen-antibody test.  According to results of those test I will determine if the patient has a diagnosis of chronic hepatitis C and or syphilis, and I will determine further diagnostic work-up and treatment accordingly.  Thank you very much for this consultation, I will be available for follow-up as needed.

## 2022-07-29 ENCOUNTER — Encounter: Admit: 2022-07-29 | Discharge: 2022-07-29 | Payer: No Typology Code available for payment source

## 2022-07-29 NOTE — Progress Notes
07/29/22  Tests results discussed with patient in a telephone conversation.     07/26/22 07/26/22    Syphilis AB, Total NEG    Chlamydia Trachomatis Probe  NEG   Neisseria Gonorroeae PCR  NEG   Anti HBc Total NONREACTIVE    Anti HBs NEG    HBsAg NONREACTIVE    Hepatitis C Virus, IU/mL HCV RNA Not Detected    Anti HCV REACTIVE !    HIV 1 and 2 AG AB Screen NONREACTIVE      In summary:  1. No evidence of prior or current syphilis.  2. Hepatitis C antibody remains positive, but Hep C RNA viral load remains undetectable.   As discussed, this combination of test results can be seen in cases where patient do not develop chronic hepatitis after acute infection (spontaneaous clearance), and also following successful treatment of chronic hepatitis C (patient denies any previous treatment). Finally, an alternative explanation would be a false positive Hep C antibody.  No indication for treatment, or any further diagnostic work-up for now.   Patient will need Hepatitis B vaccine series (HEPLISAV-B): 2 IM injections 4 weeks apart.  Discussed in detail. All questions answered.        Staff name:  Delton Prairie, MD  Division of Infectious Diseases  Voalte, or pager Voalte  Date:  07/29/2022

## 2022-11-02 NOTE — Patient Instructions
.  --   Preferred method of communication is through MyChart message, if the issue cannot wait until your next scheduled follow up.   -- MyChart may be used for non-emergent communication. MyChart messages are not reviewed after hours or over the weekend/holidays/after 4PM. Staff will reply to your email within 24-48 business hours.       -- If you do not hear from us within one week of a lab or imaging study being completed, please call/send MyChart message to the office to be sure that we have received the results. This is especially challenging when tests are done outside of the [facility] system, as many times results do not make it back to our office for a variety of reasons. In our office no news is good news does not apply. You should hear from us with results for each test.  [facility] lab/imaging results:  Due to the CARES act, results automatically release to MyChart.  Dr. Varon will continue to send you a result note on any labs that he orders.  With these changes, you may see your results before Dr. Varon does. Please allow up to 72 hours for review and response to your results.     -- If you are having acute (new/sudden onset) or severe/worsening neurologic symptoms, please call 911 or seek care in ED.    -- For scheduling of IMAGING/RADIOLOGY, please call [phone] at your convenience to schedule your studies.  -- For referrals placed during the visit, if you have not heard from scheduling within one week, please call the call center at [phone] to get scheduling assistance.  -- For refills on medications, please first contact your pharmacy, who will fax a refill authorization request form to our office.  Weekdays only. Allow up to 2 business days for refills. Please plan ahead, as refills will not be filled after hours.  -- Our scheduling staff, Lindsey, may be contacted [phone] for scheduling needs.   -- Salif Tay, RN, may be contacted at [phone] for urgent needs. Staff will return your call within 24 business hours.     For Appointments:   -- Please try to arrive early for your appointment time to help facilitate your visit. 15 minutes early is recommended.   -- If you are late to your appointment, we reserve the right to ask you to reschedule or wait until next available time to be seen in fairness to other patients scheduled that day.   -- There are times when we are running behind in clinic. Our goal is to always be on time, however, there are time when unexpected events occur with patients, which may cause a delay. We appreciate your understanding when this occurs.

## 2022-11-03 ENCOUNTER — Encounter: Admit: 2022-11-03 | Discharge: 2022-11-03 | Payer: No Typology Code available for payment source

## 2022-11-03 ENCOUNTER — Ambulatory Visit: Admit: 2022-11-03 | Discharge: 2022-11-03 | Payer: No Typology Code available for payment source

## 2022-11-03 DIAGNOSIS — R202 Paresthesia of skin: Secondary | ICD-10-CM

## 2022-11-03 NOTE — Progress Notes
EMG/NCS Procedure Time Out Check List:  Prior to the start of the procedure, I personally confirmed the following:      Patient Identity (name & date of birth): Yes  Procedure: Yes  Site: Yes  Body Part: Yes    Time out was performed. On the verbal pain analog scale of 0 to 10, patient's pain level was 0 before the procedure and 0 afterwards. Test was completed without complication.     The risks of the procedure, including pain, were discussed with the patient.

## 2022-11-08 ENCOUNTER — Encounter: Admit: 2022-11-08 | Discharge: 2022-11-08 | Payer: No Typology Code available for payment source

## 2022-11-08 NOTE — Telephone Encounter
-----   Message from Sherol Dade, MD sent at 11/05/2022  3:17 PM CST -----      Please inform patient about recent EMG. The study was normal, there was no evidence of neuropathy. We can further discuss about the results of this test during next clinic visit.    Thanks    Stacey Frost

## 2022-11-08 NOTE — Telephone Encounter
Spoke with patient, verified name and DOB, relayed Dr. Gerre Pebbles EMG result note:    "The study was normal, there was no evidence of neuropathy. We can further discuss about the results of this test during next clinic visit. "    Confirmed next clinic visit with Dr. Vinson Moselle on 12/01/2022 with patient. Patient verbalized understanding and had no further questions at this time.     Lucienne Minks, RN

## 2022-11-26 ENCOUNTER — Encounter: Admit: 2022-11-26 | Discharge: 2022-11-26 | Payer: No Typology Code available for payment source

## 2022-11-26 NOTE — Patient Instructions
--   Preferred method of communication is through MyChart message, if the issue cannot wait until your next scheduled follow up.   -- MyChart may be used for non-emergent communication. MyChart messages are not reviewed after hours or over the weekend/holidays/after 4PM. Staff will reply to your email within 24-48 business hours.       -- If you do not hear from us within one week of a lab or imaging study being completed, please call/send MyChart message to the office to be sure that we have received the results. This is especially challenging when tests are done outside of the [facility] system, as many times results do not make it back to our office for a variety of reasons. In our office no news is good news does not apply. You should hear from us with results for each test.  [facility] lab/imaging results:  Due to the CARES act, results automatically release to MyChart.  Dr. Varon will continue to send you a result note on any labs that he orders.  With these changes, you may see your results before Dr. Varon does. Please allow up to 72 hours for review and response to your results.     -- If you are having acute (new/sudden onset) or severe/worsening neurologic symptoms, please call 911 or seek care in ED.    -- For scheduling of IMAGING/RADIOLOGY, please call [phone] at your convenience to schedule your studies.  -- For referrals placed during the visit, if you have not heard from scheduling within one week, please call the call center at [phone] to get scheduling assistance.  -- For refills on medications, please first contact your pharmacy, who will fax a refill authorization request form to our office.  Weekdays only. Allow up to 2 business days for refills. Please plan ahead, as refills will not be filled after hours.  -- Our scheduling staff, Lindsey, may be contacted [phone] for scheduling needs.   -- Annaliz Aven, RN, may be contacted at [phone] for urgent needs. Staff will return your call within 24 business hours.     For Appointments:   -- Please try to arrive early for your appointment time to help facilitate your visit. 15 minutes early is recommended.   -- If you are late to your appointment, we reserve the right to ask you to reschedule or wait until next available time to be seen in fairness to other patients scheduled that day.   -- There are times when we are running behind in clinic. Our goal is to always be on time, however, there are time when unexpected events occur with patients, which may cause a delay. We appreciate your understanding when this occurs.

## 2022-12-01 ENCOUNTER — Ambulatory Visit: Admit: 2022-12-01 | Discharge: 2022-12-01 | Payer: No Typology Code available for payment source

## 2022-12-01 ENCOUNTER — Encounter: Admit: 2022-12-01 | Discharge: 2022-12-01 | Payer: No Typology Code available for payment source

## 2022-12-01 DIAGNOSIS — I10 Essential (primary) hypertension: Secondary | ICD-10-CM

## 2022-12-01 DIAGNOSIS — R55 Syncope and collapse: Secondary | ICD-10-CM

## 2022-12-01 DIAGNOSIS — Z79899 Other long term (current) drug therapy: Secondary | ICD-10-CM

## 2022-12-01 DIAGNOSIS — G459 Transient cerebral ischemic attack, unspecified: Secondary | ICD-10-CM

## 2022-12-01 DIAGNOSIS — F1210 Cannabis abuse, uncomplicated: Secondary | ICD-10-CM

## 2022-12-01 DIAGNOSIS — G5710 Meralgia paresthetica, unspecified lower limb: Secondary | ICD-10-CM

## 2022-12-01 DIAGNOSIS — M545 Chronic low back pain: Secondary | ICD-10-CM

## 2022-12-01 DIAGNOSIS — Z23 Encounter for immunization: Secondary | ICD-10-CM

## 2022-12-01 DIAGNOSIS — R4702 Dysphasia: Secondary | ICD-10-CM

## 2022-12-01 DIAGNOSIS — M48062 Spinal stenosis, lumbar region with neurogenic claudication: Secondary | ICD-10-CM

## 2022-12-01 DIAGNOSIS — M797 Fibromyalgia: Secondary | ICD-10-CM

## 2022-12-01 NOTE — Progress Notes
Stacey Frost is a 57 y.o. female.    CC: follow up       History of Present Illness    She presents fore valuation in regard to neuropathy. Has pain in the fet, may radiate up to the groin. Can be very painful. Household tasks can bae very difficult due to pain. Most severe pain is high in the legs, and the low back. They may tingle, but not always. [facility]net for >1 year.     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    TIA (transient ischemic attack)     Tussive syncope      Surgical History:   Procedure Laterality Date    HX HYSTERECTOMY       Social History     Socioeconomic History    Marital status: Single   Tobacco Use    Smoking status: Former     Current packs/day: 0.00     Types: Cigarettes     Quit date: 07/23/2022     Years since quitting: 0.3     Passive exposure: Current    Smokeless tobacco: Never   Vaping Use    Vaping status: Never Used   Substance and Sexual Activity    Alcohol use: Not Currently    Drug use: Yes     Frequency: 7.0 times per week     Types: Marijuana    Sexual activity: Yes     Partners: Male     Family History   Problem Relation Age of Onset    Cirrhosis Mother     COPD Father        Review of Systems      Objective:          albuterol sulfate (PROAIR HFA) 90 mcg/actuation HFA aerosol inhaler INHALE 2 PUFFS BY MOUTH EVERY 4 HOURS AS NEEDED FOR 30 DAYS    ALPRAZolam (XANAX) 1 mg tablet Take one tablet by mouth at bedtime daily.    amLODIPine (NORVASC) 10 mg tablet Take one tablet by mouth daily.    aspirin EC (ASPIR-LOW) 81 mg tablet Take one tablet by mouth daily.    baclofen (LIORESAL) 10 mg tablet Take one-half tablet by mouth three times daily.    cyclobenzaprine (FLEXERIL) 10 mg tablet Take one tablet by mouth at bedtime daily.    dicyclomine (BENTYL) 10 mg capsule Take 1 Cap by mouth four times daily.    duloxetine DR (CYMBALTA) 60 mg capsule Take one capsule by mouth daily.    Empty Container (ENEMA) botl Use  as directed.    gabapentin (NEURONTIN) 300 mg capsule Take one capsule by mouth three times daily. 600 am 300 noon 600 pm    hydroCHLOROthiazide (HYDRODIURIL) 25 mg tablet     HYDROCODONE BIT/HOMATROP ME-BR (HYDROCODONE COMPOUND PO) Take  by mouth.    lisinopriL (ZESTRIL) 20 mg tablet Take one tablet by mouth daily. for 30 days  LORazepam (ATIVAN) 1 mg tablet Take one tablet by mouth every 4 hours as needed.    meloxicam (MOBIC) 15 mg tablet Take one tablet by mouth daily.    omeprazole DR (PRILOSEC) 40 mg capsule Take one capsule by mouth daily.    ondansetron (ZOFRAN) 4 mg tablet Take 1 Tab by mouth every 8 hours as needed for Nausea.    traMADoL (ULTRAM) 50 mg tablet Take one tablet by mouth twice daily as needed.     There were no vitals filed for this visit.  There is no height or weight on file to calculate BMI.     Physical Exam    Neurological examination:     Mental Status: Alert, oriented, provides full history of symptoms.    Cranial Nerves:   Pupils are equal. EOMI.  Palpebral Fissure normal   Orb Oculi 5   Orb Oris 5   Tongue  5     Speech: normal    Motor Exam:  Bulk/tone: normal  Scapular winging, high arches, hammer toes: none  Strength:  Neck flexors:    Neck extensors:       Right Left  Right Left   Shoulder abductors: 5 5 Hip flexion: 5 5   Elbow flexors: 5 5 Hip abduction:     Elbow extensors: 5 5 Hip Adduction:     Wrist extensors:   Knee extension: 5 5   Wrist flexors:   Knee flexion: 5 5   Finger flexors:  FDP 2/3  FDP 4/5  FPL   Ankle dorsiflexion: 5 5   Finger extensors: 5 5 Ankle plantar flexion: Finger abductors: 5 5 Ankle eversion:     Thumb abductors:   Ankle inversion:        Toe extension: 5 5      Toe flexion:         Sensation:   Right Left   Light touch     Pinprick reduced patch near ankle, preserved distal and proximal to this reduced patch near ankle, preserved distal and proximal to this   Vibration - toes 9s 9s   Vibration - ankles     Vibration - MCPs       Coordination: Normal FTN.    Reflexes:    Right   Left     Brachioradialis   2 2   Biceps    2 2   Triceps 2 2   Knee 2 2   Ankle   2 2   Babinski sign absent absent   Hoffman sign absent absent   Jaw jerk        Gait and Station: Slight wide base, antalgic.         Assessment and Plan:    She presents to discuss paresthesias with possible neuorpathy. We previously met for EMG which was without large fiber damage. She describes prominent and diffuse pain. Themost intense and limiting of which is in the proximal thighs and low back, but also has pains in the torso, legs, and upper extremities. She describes marked worsening in pain with pressure over the extremities. She does not endorse significant sensory deficit nor distal predominant symptoms. Exam today is different than what was noted at the time of the EMG. Vibration and pinprick are normal today, has some scattered isolated pin changes that likely represent local factors.     Discussed that her current presentation with regard to the pain, paresthesias, and diffuse sensory changes is most consistent with fibromyalgia. She  describes several symptoms which are more consistent with a pain disorder and atypical for a polyneuropathy. We discussed utility of a skin biopsy to further evaluate for small fiber polyneuropathy, though would defer for now as I believe it would be low yield given her symtpoms and would not change the current management as treatable causes of neuropathy have been excluded on lab testing. Discussed that she can continue to follow with her PCP for this treatment or we can refer to our pain clinic if she prefers. She plans to continue to work with her PCP as they are currently prescribing gabapentin and duloxetine which provide some benefit. Follow up with neuromuscular as needed.    Total Time Today was 40 minutes in the following activities: Preparing to see the patient, Obtaining and/or reviewing separately obtained history, Performing a medically appropriate examination and/or evaluation, Counseling and educating the patient/family/caregiver, Ordering medications, tests, or procedures, and Documenting clinical information in the electronic or other health record.

## 2023-04-21 ENCOUNTER — Encounter: Admit: 2023-04-21 | Discharge: 2023-04-21 | Payer: No Typology Code available for payment source

## 2023-05-12 ENCOUNTER — Ambulatory Visit: Admit: 2023-05-12 | Discharge: 2023-05-12 | Payer: 59

## 2023-05-12 ENCOUNTER — Encounter: Admit: 2023-05-12 | Discharge: 2023-05-12 | Payer: 59

## 2023-05-12 DIAGNOSIS — Z23 Encounter for immunization: Secondary | ICD-10-CM

## 2023-05-12 DIAGNOSIS — Z79899 Other long term (current) drug therapy: Secondary | ICD-10-CM

## 2023-05-12 DIAGNOSIS — G5710 Meralgia paresthetica, unspecified lower limb: Secondary | ICD-10-CM

## 2023-05-12 DIAGNOSIS — G459 Transient cerebral ischemic attack, unspecified: Secondary | ICD-10-CM

## 2023-05-12 DIAGNOSIS — M48062 Spinal stenosis, lumbar region with neurogenic claudication: Secondary | ICD-10-CM

## 2023-05-12 DIAGNOSIS — I10 Essential (primary) hypertension: Secondary | ICD-10-CM

## 2023-05-12 DIAGNOSIS — R55 Syncope and collapse: Secondary | ICD-10-CM

## 2023-05-12 DIAGNOSIS — R4702 Dysphasia: Secondary | ICD-10-CM

## 2023-05-12 DIAGNOSIS — F1210 Cannabis abuse, uncomplicated: Secondary | ICD-10-CM

## 2023-05-12 DIAGNOSIS — M545 Chronic low back pain: Secondary | ICD-10-CM

## 2023-05-12 DIAGNOSIS — R202 Paresthesia of skin: Secondary | ICD-10-CM

## 2023-05-12 NOTE — Progress Notes
Date of Service: 05/12/2023    Subjective:             Stacey Frost is a 57 y.o. female here for stroke/passing out management.     History of Present Illness  Stacey Frost is a 57 y.o. female with a past medical history of HTN, degenerative disc disease, tobacco/alcohol use and possible stroke (right frontal, had episode of whole body numbness and high BP values, symptoms resolved within a few days), here for follow up. Patient has had chronic low back pain, bilateral thigh pain, LE weakness/paresthesias. 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 She also reports balance problems, but patient is able to walk without assistance and denies any recent falls or new stroke symptoms. Patient reports occasional passing out activity associated with confusion, her eyes are open occasionally and she could have tremors and violent movements reported, but denies tongue biting or incontinence with them and her daughter thinks she may be able to stop spells occasionally. They were frequent in the past and she was taking Tramadol that could have contributed to spells, but denies recent events since she stopped medication. Patient is still forgetful but denies recent episodes of confusion/disorientation, memory problems are worse lately. Patient has not been taking aspirin lately for stroke prevention, denies side effects. She denies recent mood changes or sleep problems. Carotid doppler and TTE were ordered due to syncopal events, these were done at [facility] with normal results per patient. Labs included unremarkable TSH and most recent B12 level was low, but patient did not use injections or tablets.      Medications tried:   Gabapentin 600/300/600mg  (reports some benefit, denies side effects)  Duloxetine 60mg  bid (reports some benefit, denies significant side effects)  Flexeril (denies clear benefit)  Tried baclofen but caused memory loss.        Objective:         albuterol sulfate (PROAIR HFA) 90 mcg/actuation HFA aerosol inhaler INHALE 2 PUFFS BY MOUTH EVERY 4 HOURS AS NEEDED FOR 30 DAYS    ALPRAZolam (XANAX) 1 mg tablet Take one tablet by mouth at bedtime daily.    amLODIPine (NORVASC) 10 mg tablet Take one tablet by mouth daily.    aspirin EC (ASPIR-LOW) 81 mg tablet Take one tablet by mouth daily.    baclofen (LIORESAL) 10 mg tablet Take one-half tablet by mouth three times daily.    cyclobenzaprine (FLEXERIL) 10 mg tablet Take one tablet by mouth at bedtime daily.    dicyclomine (BENTYL) 10 mg capsule Take 1 Cap by mouth four times daily.    duloxetine DR (CYMBALTA) 60 mg capsule Take one capsule by mouth daily.    Empty Container (ENEMA) botl Use  as directed.    gabapentin (NEURONTIN) 300 mg capsule Take one capsule by mouth three times daily. 600 am 300 noon 600 pm    hydroCHLOROthiazide (HYDRODIURIL) 25 mg tablet     HYDROCODONE BIT/HOMATROP ME-BR (HYDROCODONE COMPOUND PO) Take  by mouth.    lisinopriL (ZESTRIL) 20 mg tablet Take one tablet by mouth daily. for 30 days    LORazepam (ATIVAN) 1 mg tablet Take one tablet by mouth every 4 hours as needed.    meloxicam (MOBIC) 15 mg tablet Take one tablet by mouth daily.    omeprazole DR (PRILOSEC) 40 mg capsule Take one capsule by mouth daily.    ondansetron (ZOFRAN)  4 mg tablet Take 1 Tab by mouth every 8 hours as needed for Nausea.    traMADoL (ULTRAM) 50 mg tablet Take one tablet by mouth twice daily as needed.     Vitals:    05/12/23 0858   BP: 128/71   BP Source: Arm, Left Upper   Pulse: 98   Temp: 36.9 ?C (98.4 ?F)   TempSrc: Oral   PainSc: Eight   Weight: 74.8 kg (165 lb)   Height: 157.5 cm (5' 2.01)     Body mass index is 30.17 kg/m?Marland Kitchen     Physical Exam  GENERAL APPEARANCE: The patient is alert. Patient is in no acute distress, following commands and cooperative.   HEENT: Normocephalic and atraumatic.    NEUROLOGIC EXAM:   Orientation: The patient is alert and oriented times three. Patient is following commands. Speech is fluent, intact comprehension, repetition and naming.    Cranial nerves:  1st cranial nerve:  not tested   2nd cranial nerve: normal; Visual fields are full to confrontation. Pupils are equal, round, and reactive to light and accommodation.   3rd, 4th & 6th cranial nerves: Extraocular movements are intact without nystagmus.  5th cranial nerve: normal; intact muscles of mastication. Intact light touch and pin prick.  7th cranial nerve: normal; no facial asymmetry  8th cranial nerve: normal  9th & 10th cranial nerves: normal  11th cranial nerve: normal; Shoulder shrug symmetric   12th cranial nerve: normal; tongue is midline.      Strength: (Right/Left) Deltoid 5/5, Biceps 5/5, Triceps 5/5, Finger ext 5/5, interossei 5/5, Hip Flexion 5/5, Knee ext 5/5, Knee flex 5/5, Ankle dorsiflexion 5/5, Ankle plantarflexion 5/5. Normal bulk and tone in all four limbs without any evidence of an arm drift.  No abnormal movements during exam.  Sensory: Intact to pain, slightly decreased to temperature and vibration distally in both lower extremities.   Coordination is intact finger-to-nose, and rapidly alternating movements.    Deep tendon reflexes are 2+ in biceps, triceps, brachioradialis and patellar bilaterally and 1+ ankle reflex.  Hoffman sign is absent bilaterally.   Gait without ataxia, but some cautious features.     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 Patient is here for follow up. She had episodes of passing out activity associated with body shaking and confusion in the past, but denies tongue biting or incontinence with them. They were frequent in the past, but since patient stopped Tramadol, denies events. She also describes LE weakness/paresthesias and balance problems, but denies new stroke symptoms or recent falls. 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 She does not take aspirin lately and MRI brain and EEG were ordered during last clinic visit, but not done. Patient had evidence of B12 deficiency but did not get treatment. Neurological exam is similar today with distal sensory loss in both LE  We will resume aspirin and advise patient to start B12 injections (she will prefer to get injections with PCP). We will order MRI brain and EEG due to passing out events.      Recommendations:  Resume aspirin for secondary stroke prevention.  We recommend B12 injections (weekly for 1 month, then monthly for 6 months). Patient will prefer to get these with PCP.   We will order MRI head to evaluate for new intracranial abnormalities and EEG.  Patient will have a follow up appointment in 6 months.  Total time was 30 minutes. This time was spent preparing to see the patient, obtaining and/or reviewing history, performing an examination, ordering medications, tests/ procedures, communicating results to the patient, documenting clinical information in the electronic health record and counseling/educating the patient regarding stroke.

## 2023-06-09 ENCOUNTER — Encounter: Admit: 2023-06-09 | Discharge: 2023-06-09 | Payer: 59

## 2023-06-09 DIAGNOSIS — R55 Syncope and collapse: Secondary | ICD-10-CM

## 2023-07-18 ENCOUNTER — Encounter: Admit: 2023-07-18 | Discharge: 2023-07-18 | Payer: PRIVATE HEALTH INSURANCE

## 2023-08-05 ENCOUNTER — Encounter: Admit: 2023-08-05 | Discharge: 2023-08-05 | Payer: PRIVATE HEALTH INSURANCE

## 2023-11-30 ENCOUNTER — Encounter: Admit: 2023-11-30 | Discharge: 2023-11-30

## 2023-11-30 ENCOUNTER — Ambulatory Visit: Admit: 2023-11-30 | Discharge: 2023-12-01

## 2023-11-30 DIAGNOSIS — E538 Deficiency of other specified B group vitamins: Secondary | ICD-10-CM

## 2023-11-30 DIAGNOSIS — R55 Syncope and collapse: Secondary | ICD-10-CM

## 2023-11-30 MED ORDER — LAMOTRIGINE 25 MG PO TAB
ORAL_TABLET | 5 refills | Status: AC
Start: 2023-11-30 — End: ?

## 2023-11-30 NOTE — Progress Notes
 Date of Service: 11/30/2023    Subjective:             Stacey Frost is a 58 y.o. female here for follow up.     History of Present Illness  Stacey Jae D Larose 58 y.o. female with a past medical history of HTN, degenerative disc disease, tobacco/alcohol use and possible stroke (right frontal, without residual symptoms), here for follow up evaluation. Patient has had recurrent passing out activity, with confusion, generalized shaking (patient sometimes aware of tremors) and denies tongue biting, but reported incontinence with them. Coughing and stress may trigger some events, she reported right leg bruises due to trauma during passing out events. Frequency has been worse in the past few months, around 7 episodes per month, last episode was a few days ago. Patient was referred for MRI brain (could not get done due to piercing) and EEG but appointment were not scheduled. Patient has noticed mood changes (more irritable) and occasional sleep issues. She has had chronic low back pain and balance problems, with recurrent falls, but denies new stroke symptoms. B12 level was low in the past, patient was using monthly injections (last one was 2 months ago).       Medications tried:   Gabapentin 600/300/600mg  (reports some benefit, denies side effects)  Duloxetine 60mg  bid (reports some benefit, denies significant side effects)  Flexeril (denies clear benefit)  Tried baclofen but caused memory loss.       Objective:         albuterol sulfate (PROAIR HFA) 90 mcg/actuation HFA aerosol inhaler INHALE 2 PUFFS BY MOUTH EVERY 4 HOURS AS NEEDED FOR 30 DAYS    ALPRAZolam (XANAX) 1 mg tablet Take one tablet by mouth at bedtime daily.    amLODIPine (NORVASC) 10 mg tablet Take one tablet by mouth daily.    aspirin EC (ASPIR-LOW) 81 mg tablet Take one tablet by mouth daily.    baclofen (LIORESAL) 10 mg tablet Take one-half tablet by mouth three times daily.    cyclobenzaprine (FLEXERIL) 10 mg tablet Take one tablet by mouth at bedtime daily.    dicyclomine (BENTYL) 10 mg capsule Take 1 Cap by mouth four times daily.    duloxetine DR (CYMBALTA) 60 mg capsule Take one capsule by mouth daily.    Empty Container (ENEMA) botl Use  as directed.    gabapentin (NEURONTIN) 300 mg capsule Take one capsule by mouth three times daily. 600 am 300 noon 600 pm    hydroCHLOROthiazide (HYDRODIURIL) 25 mg tablet     HYDROCODONE BIT/HOMATROP ME-BR (HYDROCODONE COMPOUND PO) Take  by mouth.    lisinopriL (ZESTRIL) 20 mg tablet Take one tablet by mouth daily. for 30 days    LORazepam (ATIVAN) 1 mg tablet Take one tablet by mouth every 4 hours as needed.    meloxicam (MOBIC) 15 mg tablet Take one tablet by mouth daily.    omeprazole DR (PRILOSEC) 40 mg capsule Take one capsule by mouth daily.    ondansetron (ZOFRAN) 4 mg tablet Take 1 Tab by mouth every 8 hours as needed for Nausea.    traMADoL (ULTRAM) 50 mg tablet Take one tablet by mouth twice daily as needed.     Vitals:    11/30/23 1346   BP: 125/73   Pulse: 106   PainSc: Eight   Weight: 74.4 kg (164 lb)   Height: 157.5 cm (5' 2)     Body mass index is 30 kg/m?Marland Kitchen     Physical Exam  GENERAL APPEARANCE: The patient is alert. Patient is in no acute distress, following commands and cooperative. Well developed and well nourished.   HEENT: Normocephalic and atraumatic.    NEUROLOGIC EXAM:   Orientation: The patient is alert and oriented times three. Patient is following commands. Speech is fluent, intact comprehension, repetition and naming.    Cranial nerves:  1st cranial nerve:  not tested   2nd cranial nerve: normal; Visual fields are full to confrontation. Pupils are equal, round, and reactive to light and accommodation.   3rd, 4th & 6th cranial nerves: Extraocular movements are intact without nystagmus.  5th cranial nerve: normal; intact muscles of mastication. Intact light touch and pin prick.  7th cranial nerve: normal; no facial asymmetry  8th cranial nerve: normal  9th & 10th cranial nerves: normal; Gag is present   11th cranial nerve: normal; Shoulder shrug symmetric   12th cranial nerve: normal; tongue is midline.     Strength: (Right/Left) Deltoid 5/5, Biceps 5/5, Triceps 5/5, Finger ext 5/5, interossei 5/5, Hip Flexion 5/5, Knee ext 5/5, Knee flex 5/5, Ankle dorsiflexion 5/5, Ankle plantarflexion 5/5. Normal bulk and tone in all four limbs without any evidence of an arm drift.  Postural tremors seen during exam.  Sensory: Intact to pain, temperature and vibration in both upper/lower extremities.   Coordination is intact finger-to-nose and fine finger movements.    Deep tendon reflexes are 2+ in biceps, triceps, brachioradialis and patellar bilaterally and 1+ ankle reflex.  Hoffman sign is absent bilaterally.   Gait is without ataxia, but cautious features.     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 Patient reports episodes of passing out associated with confusion, whole body tremors (patient seems aware during some events), frequency has been worse in the past few months and patient sustained some trauma due to these events. She also describes low back pain and balance issues, as well as some mood problems, but denies new neurological symptoms and her examination is similar today. We Frost add low dose Lamotrigine due to possible diagnosis of seizures, but description of some events is not typical for epilepsy. We advised patient again to schedule EEG and we Frost refer her to our epilepsy clinic for further evaluation.     Recommendations:  Patient may continue similar Gabapentin dose, but we Frost also start low dose lamotrigine for seizure prophylaxis. We discussed about possible side effects with AEDs.   We Frost repeat B12 level and advised patient to schedule EEG   Patient was advised to keep seizure precautions as well as driving restrictions (6 months seizure-free per [place] and [place] law).  We Frost refer patient to our epilepsy clinic.      Total time was 30 minutes. This time was spent preparing to see the patient, obtaining and/or reviewing history, performing an examination, ordering medications, tests/ procedures, communicating results to the patient, documenting clinical information in the electronic health record and counseling/educating the patient regarding seizures.

## 2023-12-01 ENCOUNTER — Encounter: Admit: 2023-12-01 | Discharge: 2023-12-01

## 2023-12-01 ENCOUNTER — Ambulatory Visit: Admit: 2023-12-01 | Discharge: 2023-12-01

## 2024-04-27 ENCOUNTER — Encounter: Admit: 2024-04-27 | Discharge: 2024-04-27 | Payer: PRIVATE HEALTH INSURANCE

## 2024-04-27 NOTE — Telephone Encounter
 My chart link sent  Meds and allergies in [facility]  CT- 06-08-23- report in imaging--req images from [place]. [facility] [place]  EEG 12-01-23- [place] Notes  Referred by Dr. Sula Captain- syncope and seizures  Verified appointment and directions

## 2024-05-01 ENCOUNTER — Encounter: Admit: 2024-05-01 | Discharge: 2024-05-01 | Payer: PRIVATE HEALTH INSURANCE

## 2024-05-10 ENCOUNTER — Encounter: Admit: 2024-05-10 | Discharge: 2024-05-10 | Payer: PRIVATE HEALTH INSURANCE
